# Patient Record
Sex: Female | Born: 1960 | Race: White | Hispanic: No | Marital: Single | Smoking: Current every day smoker
Health system: Southern US, Community
[De-identification: ages and names within clinical notes are randomized; demographics above are authoritative.]

## PROBLEM LIST (undated history)

## (undated) DIAGNOSIS — F29 Unspecified psychosis not due to a substance or known physiological condition: Secondary | ICD-10-CM

## (undated) DIAGNOSIS — E079 Disorder of thyroid, unspecified: Secondary | ICD-10-CM

---

## 2011-03-17 DIAGNOSIS — F29 Unspecified psychosis not due to a substance or known physiological condition: Secondary | ICD-10-CM | POA: Insufficient documentation

## 2014-04-11 DIAGNOSIS — Z8669 Personal history of other diseases of the nervous system and sense organs: Secondary | ICD-10-CM | POA: Insufficient documentation

## 2015-10-30 ENCOUNTER — Telehealth: Payer: Self-pay | Admitting: General Practice

## 2015-10-30 NOTE — Telephone Encounter (Signed)
No- I can't take her on at this time

## 2015-10-30 NOTE — Telephone Encounter (Signed)
Pt's mother Corinna Capra is a patient of yours and she says she's met you before and she would like to establish care with you. Please advise

## 2015-11-02 NOTE — Telephone Encounter (Signed)
appt made

## 2015-11-02 NOTE — Telephone Encounter (Signed)
With Tammy SoursGreg

## 2015-11-10 DIAGNOSIS — F1020 Alcohol dependence, uncomplicated: Secondary | ICD-10-CM | POA: Insufficient documentation

## 2015-11-20 ENCOUNTER — Ambulatory Visit: Payer: Self-pay | Admitting: Family

## 2015-12-15 ENCOUNTER — Ambulatory Visit: Payer: Self-pay | Admitting: Internal Medicine

## 2015-12-21 ENCOUNTER — Ambulatory Visit: Payer: Self-pay | Admitting: Family

## 2017-02-06 DIAGNOSIS — F17200 Nicotine dependence, unspecified, uncomplicated: Secondary | ICD-10-CM | POA: Insufficient documentation

## 2019-02-09 ENCOUNTER — Emergency Department
Admission: EM | Admit: 2019-02-09 | Discharge: 2019-02-09 | Disposition: A | Discharge status: Home / Self Care | Payer: Medicare HMO | Source: Home / Self Care | Attending: Family Medicine | Admitting: Family Medicine

## 2019-02-09 ENCOUNTER — Other Ambulatory Visit: Payer: Self-pay

## 2019-02-09 ENCOUNTER — Encounter: Payer: Self-pay | Admitting: Emergency Medicine

## 2019-02-09 DIAGNOSIS — K0889 Other specified disorders of teeth and supporting structures: Secondary | ICD-10-CM | POA: Diagnosis not present

## 2019-02-09 HISTORY — DX: Disorder of thyroid, unspecified: E07.9

## 2019-02-09 MED ORDER — AMOXICILLIN 875 MG PO TABS: 875.0000 mg | ORAL_TABLET | Freq: Two times a day (BID) | ORAL | 0 refills | Status: DC

## 2019-02-09 MED ORDER — HYDROCODONE-ACETAMINOPHEN 5-325 MG PO TABS: ORAL_TABLET | ORAL | 0 refills | Status: DC

## 2019-02-09 NOTE — ED Provider Notes (Signed)
[name]    CSN: [id number] Arrival date & time: 02/09/19  1045     History   Chief Complaint Chief Complaint  Patient presents with  . Dental Pain    HPI Chelsea Grant is a 58 y.o. female.   Patient reports that a tooth in her right lower jaw chipped yesterday.  Today she has developed pain and mild swelling in her right jaw.  She denies fevers, chills, and sweats.  The history is provided by the patient.  Dental Pain  Location:  Lower Lower teeth location:  27/RL cuspid and 28/RL 1st bicuspid Quality:  Aching Severity:  Moderate Onset quality:  Sudden Duration:  1 day Timing:  Constant Progression:  Worsening Chronicity:  New Context: dental fracture   Relieved by:  Nothing Ineffective treatments:  NSAIDs Associated symptoms: facial swelling and gum swelling   Associated symptoms: no difficulty swallowing, no drooling, no facial pain, no fever, no headaches, no neck pain, no neck swelling, no oral bleeding, no oral lesions and no trismus   Risk factors: lack of dental care     Past Medical History:  Diagnosis Date  . Thyroid disease     There are no active problems to display for this patient.   History reviewed. No pertinent surgical history.  OB History   No obstetric history on file.      Home Medications    Prior to Admission medications   Medication Sig Start Date End Date Taking? Authorizing Provider  levothyroxine (SYNTHROID, LEVOTHROID) 75 MCG tablet Take 75 mcg by mouth daily before breakfast.   Yes [provider]  amoxicillin (AMOXIL) 875 MG tablet Take 1 tablet (875 mg total) by mouth 2 (two) times daily. 02/09/19   Lattie Haw, MD  HYDROcodone-acetaminophen (NORCO/VICODIN) 5-325 MG tablet Take one by mouth at bedtime as needed for pain.  May repeat in 4 to 6 hr prn 02/09/19   Lattie Haw, MD    Family History No family history on file.  Social History Social History   Tobacco Use  . Smoking  status: Current Every Day Smoker  Substance Use Topics  . Alcohol use: Yes  . Drug use: Not on file     Allergies   Patient has no known allergies.   Review of Systems Review of Systems  Constitutional: Negative for fever.  HENT: Positive for facial swelling. Negative for drooling and mouth sores.   Musculoskeletal: Negative for neck pain.  Neurological: Negative for headaches.  All other systems reviewed and are negative.    Physical Exam Triage Vital Signs ED Triage Vitals  Enc Vitals Group     BP 02/09/19 1136 97/63     Pulse Rate 02/09/19 1136 67     Resp 02/09/19 1136 18     Temp 02/09/19 1136 98.1 F (36.7 C)     Temp Source 02/09/19 1136 Oral     SpO2 02/09/19 1136 100 %     Weight 02/09/19 1138 135 lb (61.2 kg)     Height 02/09/19 1138 5' 5.5" (1.664 m)     Head Circumference --      Peak Flow --      Pain Score 02/09/19 1138 7     Pain Loc --      Pain Edu? --      Excl. in [place]? --    No data found.  Updated Vital Signs BP 97/63 (BP Location: Right Arm)   Pulse 67   Temp 98.1  F (36.7 C) (Oral)   Resp 18   Ht 5' 5.5" (1.664 m)   Wt 61.2 kg   SpO2 100%   BMI 22.12 kg/m   Visual Acuity Right Eye Distance:   Left Eye Distance:   Bilateral Distance:    Right Eye Near:   Left Eye Near:    Bilateral Near:     Physical Exam Vitals signs and nursing note reviewed.  Constitutional:      General: She is not in acute distress. HENT:     Head: Normocephalic.     Right Ear: Tympanic membrane and external ear normal.     Left Ear: Tympanic membrane and external ear normal.     Nose: Nose normal.     Mouth/Throat:     Lips: Pink.     Mouth: Mucous membranes are moist.     Dentition: Dental tenderness and dental caries present. No gingival swelling, dental abscesses or gum lesions.     Tongue: No lesions.     Pharynx: Oropharynx is clear.      Comments: Teeth and gingiva tender to palpation as noted on diagram.  Eyes:     Pupils: Pupils are  equal, round, and reactive to light.  Cardiovascular:     Rate and Rhythm: Normal rate.  Pulmonary:     Effort: Pulmonary effort is normal.  Lymphadenopathy:     Cervical: No cervical adenopathy.  Skin:    General: Skin is warm and dry.  Neurological:     Mental Status: She is alert.      UC Treatments / Results  Labs (all labs ordered are listed, but only abnormal results are displayed) Labs Reviewed - No data to display  EKG None  Radiology No results found.  Procedures Procedures (including critical care time)  Medications Ordered in UC Medications - No data to display  Initial Impression / Assessment and Plan / UC Course  I have reviewed the triage vital signs and the nursing notes.  Pertinent labs & imaging results that were available during my care of the patient were reviewed by me and considered in my medical decision making (see chart for details).    Begin amoxicillin.  Rx for Lortab for pain at night (#10, no refill).  Controlled Substance Prescriptions I have consulted the [place] Controlled Substances Registry for this patient, and feel the risk/benefit ratio today is favorable for proceeding with this prescription for a controlled substance.   Followup with dentist in about 10 days.   Final Clinical Impressions(s) / UC Diagnoses   Final diagnoses:  Pain, dental     Discharge Instructions     May continue Ibuprofen 200mg , 4 tabs every 8 hours with food.  If symptoms become significantly worse during the night or over the weekend, proceed to the local emergency room.     ED Prescriptions    Medication Sig Dispense Auth. Provider   amoxicillin (AMOXIL) 875 MG tablet Take 1 tablet (875 mg total) by mouth 2 (two) times daily. 20 tablet Lattie Haw, MD   HYDROcodone-acetaminophen (NORCO/VICODIN) 5-325 MG tablet Take one by mouth at bedtime as needed for pain.  May repeat in 4 to 6 hr prn 10 tablet Cathren Harsh Tera Mater, MD        Lattie Haw,  MD 02/21/19 [phone]

## 2019-02-09 NOTE — ED Triage Notes (Signed)
Patient broke tooth off in lower right jaw yesterday; now has pain and edema along jaw. Took ibuprofen 400mg  at around 0900. Does have DDS to follow with.

## 2019-02-09 NOTE — Discharge Instructions (Addendum)
May continue Ibuprofen 200mg, 4 tabs every 8 hours with food.  °If symptoms become significantly worse during the night or over the weekend, proceed to the local emergency room.  °

## 2019-04-20 DIAGNOSIS — F319 Bipolar disorder, unspecified: Secondary | ICD-10-CM | POA: Insufficient documentation

## 2020-03-18 ENCOUNTER — Emergency Department: Payer: Medicare HMO

## 2020-03-18 ENCOUNTER — Emergency Department
Admission: EM | Admit: 2020-03-18 | Discharge: 2020-03-18 | Disposition: A | Discharge status: Home / Self Care | Payer: Medicare HMO | Attending: Student | Admitting: Student

## 2020-03-18 ENCOUNTER — Other Ambulatory Visit: Payer: Self-pay

## 2020-03-18 DIAGNOSIS — M79605 Pain in left leg: Secondary | ICD-10-CM | POA: Diagnosis not present

## 2020-03-18 DIAGNOSIS — R413 Other amnesia: Secondary | ICD-10-CM | POA: Insufficient documentation

## 2020-03-18 DIAGNOSIS — M542 Cervicalgia: Secondary | ICD-10-CM | POA: Diagnosis not present

## 2020-03-18 DIAGNOSIS — K5900 Constipation, unspecified: Secondary | ICD-10-CM | POA: Diagnosis not present

## 2020-03-18 DIAGNOSIS — R140 Abdominal distension (gaseous): Secondary | ICD-10-CM | POA: Insufficient documentation

## 2020-03-18 DIAGNOSIS — M25532 Pain in left wrist: Secondary | ICD-10-CM | POA: Insufficient documentation

## 2020-03-18 DIAGNOSIS — F17200 Nicotine dependence, unspecified, uncomplicated: Secondary | ICD-10-CM | POA: Diagnosis not present

## 2020-03-18 DIAGNOSIS — Y92002 Bathroom of unspecified non-institutional (private) residence single-family (private) house as the place of occurrence of the external cause: Secondary | ICD-10-CM | POA: Insufficient documentation

## 2020-03-18 DIAGNOSIS — W19XXXA Unspecified fall, initial encounter: Secondary | ICD-10-CM | POA: Insufficient documentation

## 2020-03-18 DIAGNOSIS — Y92009 Unspecified place in unspecified non-institutional (private) residence as the place of occurrence of the external cause: Secondary | ICD-10-CM

## 2020-03-18 DIAGNOSIS — Y939 Activity, unspecified: Secondary | ICD-10-CM | POA: Insufficient documentation

## 2020-03-18 DIAGNOSIS — Y999 Unspecified external cause status: Secondary | ICD-10-CM | POA: Diagnosis not present

## 2020-03-18 DIAGNOSIS — R5383 Other fatigue: Secondary | ICD-10-CM | POA: Insufficient documentation

## 2020-03-18 DIAGNOSIS — E039 Hypothyroidism, unspecified: Secondary | ICD-10-CM | POA: Diagnosis not present

## 2020-03-18 DIAGNOSIS — R519 Headache, unspecified: Secondary | ICD-10-CM | POA: Insufficient documentation

## 2020-03-18 DIAGNOSIS — M79604 Pain in right leg: Secondary | ICD-10-CM | POA: Diagnosis not present

## 2020-03-18 DIAGNOSIS — M549 Dorsalgia, unspecified: Secondary | ICD-10-CM | POA: Diagnosis not present

## 2020-03-18 DIAGNOSIS — R5381 Other malaise: Secondary | ICD-10-CM | POA: Insufficient documentation

## 2020-03-18 LAB — CBC WITH DIFFERENTIAL/PLATELET
Abs Immature Granulocytes: 0.02 10*3/uL (ref 0.00–0.07)
Basophils Absolute: 0.1 10*3/uL (ref 0.0–0.1)
Basophils Relative: 1 %
Eosinophils Absolute: 0.1 10*3/uL (ref 0.0–0.5)
Eosinophils Relative: 1 %
HCT: 37.2 % (ref 36.0–46.0)
Hemoglobin: 12.7 g/dL (ref 12.0–15.0)
Immature Granulocytes: 0 %
Lymphocytes Relative: 35 %
Lymphs Abs: 2.8 10*3/uL (ref 0.7–4.0)
MCH: 33.5 pg (ref 26.0–34.0)
MCHC: 34.1 g/dL (ref 30.0–36.0)
MCV: 98.2 fL (ref 80.0–100.0)
Monocytes Absolute: 0.6 10*3/uL (ref 0.1–1.0)
Monocytes Relative: 7 %
Neutro Abs: 4.6 10*3/uL (ref 1.7–7.7)
Neutrophils Relative %: 56 %
Platelets: 376 10*3/uL (ref 150–400)
RBC: 3.79 MIL/uL — ABNORMAL LOW (ref 3.87–5.11)
RDW: 13.4 % (ref 11.5–15.5)
WBC: 8.2 10*3/uL (ref 4.0–10.5)
nRBC: 0 % (ref 0.0–0.2)

## 2020-03-18 LAB — URINE DRUG SCREEN, QUALITATIVE (ARMC ONLY)
Amphetamines, Ur Screen: NOT DETECTED
Barbiturates, Ur Screen: NOT DETECTED
Benzodiazepine, Ur Scrn: NOT DETECTED
Cannabinoid 50 Ng, Ur ~~LOC~~: NOT DETECTED
Cocaine Metabolite,Ur ~~LOC~~: NOT DETECTED
MDMA (Ecstasy)Ur Screen: NOT DETECTED
Methadone Scn, Ur: NOT DETECTED
Opiate, Ur Screen: NOT DETECTED
Phencyclidine (PCP) Ur S: NOT DETECTED
Tricyclic, Ur Screen: NOT DETECTED

## 2020-03-18 LAB — URINALYSIS, COMPLETE (UACMP) WITH MICROSCOPIC
Bacteria, UA: NONE SEEN
Bilirubin Urine: NEGATIVE
Glucose, UA: NEGATIVE mg/dL
Hgb urine dipstick: NEGATIVE
Ketones, ur: NEGATIVE mg/dL
Leukocytes,Ua: NEGATIVE
Nitrite: NEGATIVE
Protein, ur: NEGATIVE mg/dL
Specific Gravity, Urine: 1.028 (ref 1.005–1.030)
pH: 7 (ref 5.0–8.0)

## 2020-03-18 LAB — BASIC METABOLIC PANEL
Anion gap: 10 (ref 5–15)
BUN: 14 mg/dL (ref 6–20)
CO2: 23 mmol/L (ref 22–32)
Calcium: 8.1 mg/dL — ABNORMAL LOW (ref 8.9–10.3)
Chloride: 102 mmol/L (ref 98–111)
Creatinine, Ser: 0.9 mg/dL (ref 0.44–1.00)
GFR calc Af Amer: >60 mL/min (ref >60)
GFR calc non Af Amer: >60 mL/min (ref >60)
Glucose, Bld: 115 mg/dL — ABNORMAL HIGH (ref 70–99)
Potassium: 3.7 mmol/L (ref 3.5–5.1)
Sodium: 135 mmol/L (ref 135–145)

## 2020-03-18 LAB — TSH: TSH: 56 u[IU]/mL — ABNORMAL HIGH (ref 0.350–4.500)

## 2020-03-18 MED ORDER — POLYETHYLENE GLYCOL 3350 17 G PO PACK
17.0000 g | PACK | Freq: Every day | ORAL | Status: DC
  Administered 2020-03-18: 17 g via ORAL
  Filled 2020-03-18: qty 1

## 2020-03-18 MED ORDER — DICYCLOMINE HCL 10 MG PO CAPS
20.0000 mg | ORAL_CAPSULE | Freq: Once | ORAL | Status: AC
  Administered 2020-03-18: 20 mg via ORAL
  Filled 2020-03-18: qty 2

## 2020-03-18 MED ORDER — MAGNESIUM CITRATE PO SOLN
1.0000 | Freq: Once | ORAL | Status: AC
  Administered 2020-03-18: 1 via ORAL
  Filled 2020-03-18: qty 296

## 2020-03-18 MED ORDER — TRAMADOL HCL 50 MG PO TABS
50.0000 mg | ORAL_TABLET | Freq: Once | ORAL | Status: AC
  Administered 2020-03-18: 50 mg via ORAL
  Filled 2020-03-18: qty 1

## 2020-03-18 MED ORDER — LEVOTHYROXINE SODIUM 125 MCG PO TABS: 125.0000 ug | ORAL_TABLET | Freq: Every day | ORAL | 2 refills | Status: DC

## 2020-03-18 MED ORDER — DEXAMETHASONE SODIUM PHOSPHATE 10 MG/ML IJ SOLN
10.0000 mg | Freq: Once | INTRAMUSCULAR | Status: AC
  Administered 2020-03-18: 10 mg via INTRAMUSCULAR
  Filled 2020-03-18: qty 1

## 2020-03-18 MED ORDER — LEVOTHYROXINE SODIUM 50 MCG PO TABS
150.0000 ug | ORAL_TABLET | Freq: Once | ORAL | Status: AC
  Administered 2020-03-18: 150 ug via ORAL
  Filled 2020-03-18: qty 3

## 2020-03-18 NOTE — ED Provider Notes (Signed)
[facility] Emergency Department Provider Note ____________________________________________  Time seen: 1418  I have reviewed the triage vital signs and the nursing notes.  HISTORY  Chief Complaint  Fall  HPI Chelsea Grant is a 59 y.o. female presents herself to the ED to EMS from home, with a complaint of 4 to 5 months worth of fatigue, malaise, and poor energy.  She also reports forgetfulness, bloating, and constipation.  Patient denies any outright fevers, chills, sweats patient also denies any chest pain, shortness of breath, or syncope.  She does admit to a mechanical fall last night resulting in her hitting her head while in the bathroom.  She describes pain to the left side of the forehead as well as some left wrist pain.   Patient also reports generalized pain to the neck, back, and legs.  She describes "pain all over.".  Patient has been evaluated on previous occasions at [facility] emergency department with similar complaints.  She has failed to follow with primary provider in the interim.  Patient with a history of hypothyroidism also admits to not having her thyroid medicine in over 3 months.  She describes making contact with her provider prior to that requested an increase in her medication dose, but has been unsuccessful.  She presents now for evaluation of her multiple complaints.  Past Medical History:  Diagnosis Date  . Thyroid disease     There are no problems to display for this patient.   History reviewed. No pertinent surgical history.  Prior to Admission medications   Medication Sig Start Date End Date Taking? Authorizing Provider  levothyroxine (SYNTHROID) 125 MCG tablet Take 1 tablet (125 mcg total) by mouth daily. 03/18/20 06/16/20  Kelleigh Skerritt, Dannielle Karvonen, PA-C    Allergies Patient has no known allergies.  No family history on file.  Social History Social History   Tobacco Use  . Smoking status: Current Every Day Smoker   Substance Use Topics  . Alcohol use: Yes  . Drug use: Not on file    Review of Systems  Constitutional: Negative for fever.  Reports generalized fatigue. Eyes: Negative for visual changes. ENT: Negative for sore throat. Cardiovascular: Negative for chest pain. Respiratory: Negative for shortness of breath. Gastrointestinal: Negative for abdominal pain, vomiting and diarrhea. Genitourinary: Negative for dysuria. Musculoskeletal: Negative for back pain.  Reports generalized myalgias.  Reports left wrist pain. Skin: Negative for rash. Neurological: Negative for headaches, focal weakness or numbness. ____________________________________________  PHYSICAL EXAM:  VITAL SIGNS: ED Triage Vitals  Enc Vitals Group     BP 03/18/20 1333 101/66     Pulse Rate 03/18/20 1333 78     Resp 03/18/20 1333 18     Temp 03/18/20 1333 99.2 F (37.3 C)     Temp src --      SpO2 03/18/20 1333 95 %     Weight 03/18/20 1331 134 lb 14.7 oz (61.2 kg)     Height 03/18/20 1331 5\' 4"  (1.626 m)     Head Circumference --      Peak Flow --      Pain Score 03/18/20 1331 10     Pain Loc --      Pain Edu? --      Excl. in [place]? --     Constitutional: Alert and oriented. Well appearing and in no distress. Head: Normocephalic and atraumatic.  No battle sign, raccoon eyes, soft tissue swelling, abrasion, lacerations. Eyes: Conjunctivae are normal. PERRL. Normal extraocular movements and  fundi bilaterally.  No exophthalmos noted. Ears: Canals clear. TMs intact bilaterally. Nose: No congestion/rhinorrhea/epistaxis. Mouth/Throat: Mucous membranes are moist. Neck: Supple. No thyromegaly. Cardiovascular: Normal rate, regular rhythm. Normal distal pulses. Respiratory: Normal respiratory effort. No wheezes/rales/rhonchi. Gastrointestinal: Soft and nontender. No distention. Musculoskeletal: Nontender with normal range of motion in all extremities.  Neurologic: Cranial nerves II through XII grossly intact.  Normal  UE DTRs bilaterally.  Normal gait without ataxia. Normal speech and language. No gross focal neurologic deficits are appreciated. Skin:  Skin is warm, dry and intact. No rash noted. Psychiatric: Mood and affect are normal. Patient exhibits appropriate insight and judgment. ____________________________________________   LABS (pertinent positives/negatives) Labs Reviewed  URINALYSIS, COMPLETE (UACMP) WITH MICROSCOPIC - Abnormal; Notable for the following components:      Result Value   Color, Urine YELLOW (*)    APPearance HAZY (*)    All other components within normal limits  BASIC METABOLIC PANEL - Abnormal; Notable for the following components:   Glucose, Bld 115 (*)    Calcium 8.1 (*)    All other components within normal limits  CBC WITH DIFFERENTIAL/PLATELET - Abnormal; Notable for the following components:   RBC 3.79 (*)    All other components within normal limits  TSH - Abnormal; Notable for the following components:   TSH 56.000 (*)    All other components within normal limits  URINE DRUG SCREEN, QUALITATIVE ([facility] ONLY)  T3  T4, FREE  ____________________________________________   RADIOLOGY  CT Head w/o CM IMPRESSION: Negative head CT.  ABD 1 View IMPRESSION: Moderate colonic stool burden. No evidence of bowel obstruction or free air.  DG Left Wrist IMPRESSION: No fracture or dislocation of the left wrist. ____________________________________________  PROCEDURES  Miralax 17 g PO Mag Citrate 1 bottle Bentyl 20 mg PO Ultram 50 mg PO Levothyroxine 150 mcg PO  Procedures ____________________________________________  INITIAL IMPRESSION / ASSESSMENT AND PLAN / ED COURSE  Patient with ED evaluation of multiple complaints including generalized fatigue, malaise, mental fogginess, and constipation.  Patient's exam is overall benign reassuring that any acute neuromuscular deficit.  CT of the head does not reveal any acute intracranial process.  Abdomen XR does  reveal moderate stool burden as well as some colonic gas.  No fracture of the left wrist is appreciated.  Patient's clinical picture is consistent with an acute hypothyroidism that is currently uncontrolled.  Patient general symptoms are also consistent with her current uncontrolled hypothyroidism.  Patient will be given a dose of levothyroxine at 150 here in the ED.  Prescription for levothyroxine at 125 mcg is provided.  She is encouraged to follow-up with primary provider for ongoing evaluation and maintenance.  Return precautions have been reviewed, patient verbalized understanding of her need to follow-up and be managed closely for her thyroid disorder.  Free T3/T4 are pending at the time of this disposition.  Kayah Hecker was evaluated in Emergency Department on 03/18/2020 for the symptoms described in the history of present illness. She was evaluated in the context of the global COVID-19 pandemic, which necessitated consideration that the patient might be at risk for infection with the SARS-CoV-2 virus that causes COVID-19. Institutional protocols and algorithms that pertain to the evaluation of patients at risk for COVID-19 are in a state of rapid change based on information released by regulatory bodies including the CDC and federal and state organizations. These policies and algorithms were followed during the patient's care in the ED. ____________________________________________  FINAL CLINICAL IMPRESSION(S) / ED DIAGNOSES  Final diagnoses:  Abdominal bloating  Fall at home, initial encounter  Hypothyroidism, unspecified type      Lissa Hoard, PA-C 03/18/20 1855    Miguel Aschoff., MD 03/18/20 2131

## 2020-03-18 NOTE — ED Triage Notes (Addendum)
Pt comes via EMS with c/o fall. Pt fell in bathroom night. Pt c/o headache and left wrist pain.  Pt states pain in neck and back and legs. Pt states pain all over.  Pt states it hurts to stand up.

## 2020-03-18 NOTE — Discharge Instructions (Addendum)
You should follow up with your primary provider for ongoing management of your hypothyroidism.  A prescription has been provided for you benefit.  Take as directed or return to the ED for acutely worsening symptoms.

## 2020-05-20 ENCOUNTER — Emergency Department (HOSPITAL_COMMUNITY)
Admission: EM | Admit: 2020-05-20 | Discharge: 2020-05-21 | Disposition: A | Discharge status: Home / Self Care | Payer: Medicare HMO | Attending: Emergency Medicine | Admitting: Emergency Medicine

## 2020-05-20 ENCOUNTER — Encounter (HOSPITAL_COMMUNITY): Payer: Self-pay

## 2020-05-20 DIAGNOSIS — M542 Cervicalgia: Secondary | ICD-10-CM | POA: Diagnosis not present

## 2020-05-20 DIAGNOSIS — R1084 Generalized abdominal pain: Secondary | ICD-10-CM | POA: Insufficient documentation

## 2020-05-20 DIAGNOSIS — F320 Major depressive disorder, single episode, mild: Secondary | ICD-10-CM | POA: Diagnosis not present

## 2020-05-20 DIAGNOSIS — Z20822 Contact with and (suspected) exposure to covid-19: Secondary | ICD-10-CM | POA: Insufficient documentation

## 2020-05-20 DIAGNOSIS — F17210 Nicotine dependence, cigarettes, uncomplicated: Secondary | ICD-10-CM | POA: Diagnosis not present

## 2020-05-20 DIAGNOSIS — E039 Hypothyroidism, unspecified: Secondary | ICD-10-CM | POA: Insufficient documentation

## 2020-05-20 DIAGNOSIS — M549 Dorsalgia, unspecified: Secondary | ICD-10-CM | POA: Diagnosis not present

## 2020-05-20 DIAGNOSIS — R519 Headache, unspecified: Secondary | ICD-10-CM | POA: Insufficient documentation

## 2020-05-20 DIAGNOSIS — R413 Other amnesia: Secondary | ICD-10-CM | POA: Diagnosis present

## 2020-05-20 DIAGNOSIS — F4323 Adjustment disorder with mixed anxiety and depressed mood: Secondary | ICD-10-CM | POA: Insufficient documentation

## 2020-05-20 DIAGNOSIS — Z79899 Other long term (current) drug therapy: Secondary | ICD-10-CM | POA: Diagnosis not present

## 2020-05-20 DIAGNOSIS — F319 Bipolar disorder, unspecified: Secondary | ICD-10-CM

## 2020-05-20 LAB — URINALYSIS, ROUTINE W REFLEX MICROSCOPIC
Glucose, UA: NEGATIVE mg/dL
Hgb urine dipstick: NEGATIVE
Ketones, ur: 5 mg/dL — AB
Nitrite: NEGATIVE
Protein, ur: 30 mg/dL — AB
Specific Gravity, Urine: 1.038 — ABNORMAL HIGH (ref 1.005–1.030)
pH: 5 (ref 5.0–8.0)

## 2020-05-20 LAB — CBC WITH DIFFERENTIAL/PLATELET
Abs Immature Granulocytes: 0.04 10*3/uL (ref 0.00–0.07)
Basophils Absolute: 0.1 10*3/uL (ref 0.0–0.1)
Basophils Relative: 1 %
Eosinophils Absolute: 0.2 10*3/uL (ref 0.0–0.5)
Eosinophils Relative: 2 %
HCT: 42.7 % (ref 36.0–46.0)
Hemoglobin: 14.4 g/dL (ref 12.0–15.0)
Immature Granulocytes: 0 %
Lymphocytes Relative: 25 %
Lymphs Abs: 3 10*3/uL (ref 0.7–4.0)
MCH: 33.5 pg (ref 26.0–34.0)
MCHC: 33.7 g/dL (ref 30.0–36.0)
MCV: 99.3 fL (ref 80.0–100.0)
Monocytes Absolute: 0.8 10*3/uL (ref 0.1–1.0)
Monocytes Relative: 6 %
Neutro Abs: 7.8 10*3/uL — ABNORMAL HIGH (ref 1.7–7.7)
Neutrophils Relative %: 66 %
Platelets: 552 10*3/uL — ABNORMAL HIGH (ref 150–400)
RBC: 4.3 MIL/uL (ref 3.87–5.11)
RDW: 12 % (ref 11.5–15.5)
WBC: 11.8 10*3/uL — ABNORMAL HIGH (ref 4.0–10.5)
nRBC: 0 % (ref 0.0–0.2)

## 2020-05-20 LAB — COMPREHENSIVE METABOLIC PANEL
ALT: 33 U/L (ref 0–44)
AST: 30 U/L (ref 15–41)
Albumin: 4.8 g/dL (ref 3.5–5.0)
Alkaline Phosphatase: 87 U/L (ref 38–126)
Anion gap: 12 (ref 5–15)
BUN: 19 mg/dL (ref 6–20)
CO2: 25 mmol/L (ref 22–32)
Calcium: 9.4 mg/dL (ref 8.9–10.3)
Chloride: 103 mmol/L (ref 98–111)
Creatinine, Ser: 1.07 mg/dL — ABNORMAL HIGH (ref 0.44–1.00)
GFR calc Af Amer: >60 mL/min (ref >60)
GFR calc non Af Amer: 57 mL/min — ABNORMAL LOW (ref >60)
Glucose, Bld: 114 mg/dL — ABNORMAL HIGH (ref 70–99)
Potassium: 4.8 mmol/L (ref 3.5–5.1)
Sodium: 140 mmol/L (ref 135–145)
Total Bilirubin: 1.5 mg/dL — ABNORMAL HIGH (ref 0.3–1.2)
Total Protein: 8.4 g/dL — ABNORMAL HIGH (ref 6.5–8.1)

## 2020-05-20 NOTE — ED Triage Notes (Signed)
Pt presents with c/o headache and confusion. Pt reports that she has been in the bed for 7 months and 3 months ago, she fell and has not "been right" since. Pt reports she was seen after the fall and had a CT but believes she needs an MRI. Pt is alert and oriented and able to answer all questions.

## 2020-05-20 NOTE — ED Provider Notes (Signed)
[facility]-EMERGENCY DEPT Provider Note   CSN: [id number] Arrival date & time: 05/20/20  1713     History Chief Complaint  Patient presents with   Headache    Chelsea Grant is a 59 y.o. female with a history of bipolar disorder, bipolar affective psychosis, hypothyroidism, anxiety who presents the emergency department with a chief complaint of "I'm having memory issues."   Patient states that she has been having memory issues for "a long time".  States that she can remember remote memories, but is having difficulty remembering more recent memories.  She reports that she is having multiple areas of pain, including a severe posterior headache, neck pain, back pain, abdominal pain, nausea, and pain in her legs.  She is unable to state how long the symptoms have been ongoing, but states that she spends all day in her room.  Also is concerned that she may have ruptured an eardrum as she has "drainage" from the left ear. She thinks that she has been taking her home levothyroxine, but is unsure if she has been taking other medications.  She is a current 1 pack/day smoker.  She reports that she used to use alcohol, but cannot remember if she has been drinking alcohol recently.  She does not believe that she has had any other illicit or recreational substance use.  Spoke with the patient's roommate, Tammy Sours.  Reports that she has been spending almost the entire day in her room since around the beginning of the year.  He reports they have lived together for several years that she had a similar issue last year where she began having erratic behavior and leaving puzzling notes and covering the TV.  He reports that she has been acting similarly, but behavior has not been quite as erratic as previous episodes.  He does note that she has been complaining of all over pain for months.  He does not know if she has been drinking alcohol, but has a low suspicion for illicit substance use.   He  states that last night she did state "that she would be better off dead."  He denies any HI, auditory, or visualizations.  She denies fever, chills, numbness, weakness, rash, neck stiffness, vomiting, diarrhea, dysuria, hematuria, vaginal pain, or bleeding.  She does have a history of hypothyroidism, but states that she cannot remember if she has been taking this medication.  Per chart review, she was seen by primary care yesterday and was noted to have presented with a wandering and bizarre history where she stated that she had been " a prisoner of my bed" for 7 months despite having multiple ER visits.  Per previous chart review on 6/1: "ED provider spoke with patients roommate, Tammy Sours, and reported the following, Roommate states that abnormal behavior has been going on for several months. States that patient was transferred to inpatient psychiatric rehab called [facility] but left a week ago and has been continuing to deteriorate. Roommate states that patient has self-isolated herself in the house and will continue to have conversations with unknown parties. Roommate states that patient has been experiencing delusions and odd behavior.  Level 5 caveat secondary to altered mental status.   The history is provided by the patient. No language interpreter was used.       Past Medical History:  Diagnosis Date   Thyroid disease     There are no problems to display for this patient.   History reviewed. No pertinent surgical history.   OB  History   No obstetric history on file.     History reviewed. No pertinent family history.  Social History   Tobacco Use   Smoking status: Current Every Day Smoker  Substance Use Topics   Alcohol use: Yes   Drug use: Not on file    Home Medications Prior to Admission medications   Medication Sig Start Date End Date Taking? Authorizing Provider  acetaminophen (TYLENOL) 500 MG tablet Take 500 mg by mouth every 6 (six) hours as needed for mild  pain.   Yes [provider]  levothyroxine (SYNTHROID) 125 MCG tablet Take 1 tablet (125 mcg total) by mouth daily. 03/18/20 06/16/20 Yes Menshew, Charlesetta Ivory, PA-C    Allergies    Patient has no known allergies.  Review of Systems   Review of Systems  Unable to perform ROS: Mental status change    Physical Exam Updated Vital Signs BP (!) 120/92 (BP Location: Left Arm)    Pulse 68    Temp 98.4 F (36.9 C) (Oral)    Resp 15    SpO2 98%   Physical Exam Vitals and nursing note reviewed.  Constitutional:      General: She is not in acute distress.    Appearance: She is not ill-appearing, toxic-appearing or diaphoretic.  HENT:     Head: Normocephalic.     Comments: Toilet paper is inserted into the left ear canal.  There appears to be brown-tannish discoloration on the toilet paper that appears very similar to the patient's current state of make-up.  Bilateral TMs are intact. Eyes:     Extraocular Movements: Extraocular movements intact.     Conjunctiva/sclera: Conjunctivae normal.     Pupils: Pupils are equal, round, and reactive to light.  Cardiovascular:     Rate and Rhythm: Normal rate and regular rhythm.     Pulses: Normal pulses.     Heart sounds: Normal heart sounds. No murmur heard.  No friction rub. No gallop.   Pulmonary:     Effort: Pulmonary effort is normal. No respiratory distress.     Breath sounds: No stridor. No wheezing, rhonchi or rales.  Chest:     Chest wall: No tenderness.  Abdominal:     General: There is distension.     Palpations: Abdomen is soft. There is no mass.     Tenderness: There is abdominal tenderness. There is no right CVA tenderness, left CVA tenderness, guarding or rebound.     Hernia: No hernia is present.     Comments: Abdomen is mildly distended, but soft.  Mild diffuse tenderness palpation throughout the abdomen.   Musculoskeletal:     Cervical back: Neck supple.  Skin:    General: Skin is warm.     Coloration: Skin is not  jaundiced.     Findings: No rash.  Neurological:     Mental Status: She is alert.     Comments: Cranial nerves II through XII are grossly intact.  GCS 15.  Ambulates independently with minimally antalgic gait.  No dysmetria with finger-to-nose bilaterally.  No pronator drift.  No ataxia.  5-5 strength against resistance of the bilateral upper and lower extremities.  Sensation is intact and equal throughout.  Patient is oriented to first and last name, year.  Other questions that she states "I don't know, I'm confused."  But through conversation is able to state that she lives in [place] and knows she is at the hospital because she saw the sign.  Psychiatric:  Attention and Perception: She does not perceive auditory or visual hallucinations.        Mood and Affect: Mood is anxious and elated.        Speech: Speech is rapid and pressured and tangential.        Behavior: Behavior is hyperactive.        Cognition and Memory: She exhibits impaired recent memory.        Judgment: Judgment is impulsive.     ED Results / Procedures / Treatments   Labs (all labs ordered are listed, but only abnormal results are displayed) Labs Reviewed  CBC WITH DIFFERENTIAL/PLATELET - Abnormal; Notable for the following components:      Result Value   WBC 11.8 (*)    Platelets 552 (*)    Neutro Abs 7.8 (*)    All other components within normal limits  COMPREHENSIVE METABOLIC PANEL - Abnormal; Notable for the following components:   Glucose, Bld 114 (*)    Creatinine, Ser 1.07 (*)    Total Protein 8.4 (*)    Total Bilirubin 1.5 (*)    GFR calc non Af Amer 57 (*)    All other components within normal limits  URINALYSIS, ROUTINE W REFLEX MICROSCOPIC - Abnormal; Notable for the following components:   Color, Urine AMBER (*)    APPearance HAZY (*)    Specific Gravity, Urine 1.038 (*)    Bilirubin Urine SMALL (*)    Ketones, ur 5 (*)    Protein, ur 30 (*)    Leukocytes,Ua SMALL (*)    Bacteria,  UA RARE (*)    All other components within normal limits  RAPID URINE DRUG SCREEN, HOSP PERFORMED - Abnormal; Notable for the following components:   Tetrahydrocannabinol RESULTS UNAVAILABLE DUE TO INTERFERING SUBSTANCE (*)    All other components within normal limits  TSH - Abnormal; Notable for the following components:   TSH 24.411 (*)    All other components within normal limits  SARS CORONAVIRUS 2 BY RT PCR (HOSPITAL ORDER, PERFORMED IN [facility])  URINE CULTURE  ETHANOL  ACETAMINOPHEN LEVEL  T4, FREE  AMMONIA  T3, FREE    EKG None  Radiology No results found.  Procedures Procedures (including critical care time)  Medications Ordered in ED Medications  ondansetron (ZOFRAN) tablet 4 mg (has no administration in time range)  zolpidem (AMBIEN) tablet 5 mg (5 mg Oral Given 05/21/20 9323)  ibuprofen (ADVIL) tablet 600 mg (600 mg Oral Given 05/21/20 5573)  nicotine (NICODERM CQ - dosed in mg/24 hours) patch 21 mg (21 mg Transdermal Patch Applied 05/21/20 0818)  alum & mag hydroxide-simeth (MAALOX/MYLANTA) 200-200-20 MG/5ML suspension 30 mL (30 mLs Oral Given 05/21/20 0608)  levothyroxine (SYNTHROID) tablet 125 mcg (has no administration in time range)    ED Course  I have reviewed the triage vital signs and the nursing notes.  Pertinent labs & imaging results that were available during my care of the patient were reviewed by me and considered in my medical decision making (see chart for details).    MDM Rules/Calculators/A&P                      59 year old female with a history of bipolar disorder, bipolar affective psychosis, hypothyroidism, anxiety presenting with complaints of memory loss for the last 7 months.  She also presents with a host of other somatic complaints including headache, neck pain, back pain, abdominal pain, nausea, and bilateral leg pain.  History  is initially limited based on patient's presenting complaints, but she appears to be alert  and oriented x4 on my evaluation.  I spoke with the patient's roommate, [name], who lived up with the patient for several years and reports that she has been acting more erratic over the last 7 months and reports that previously she had a similar episode a year ago with similar erratic behavior where she had a to be admitted to an inpatient psychiatric facility.  Patient's medical record has been reviewed extensively and she has been presenting to the ER multiple times with similar complaints for the last few months.   Vital signs are normal on my evaluation.  She has no neurologic deficits.  She has had multiple negative CT scans since her symptoms began.  Given the chronicity of her symptoms with a normal neurologic exam, MRI is not warranted at this time.  Regarding her somatic complaints, no focal findings.  Given concern for confusion, considered alcohol withdrawal or Wernicke's encephalopathy, this is less likely given that she has denied alcohol use during several recent ER visits.  Ammonia level is normal.  Ethanol level is not elevated.  UDS is negative.  She does have a history of hypothyroidism and there is questionable concern with compliance of her home levothyroxine.  TSH is elevated at 24.  Free T3 and free T4 pending.  We will restart the patient's home levothyroxine.  UA is not concerning for infection.  She has no electrolyte derangements.  No leukocytosis.  I have a low suspicion for infectious etiology.  However, given that roommate reports that she was endorsing SI last night, TTS has been consulted.  Psychiatry recommends inpatient admission.  On re-evaluation the patient's when discussing this recommendation, she states that she is here for her pain and that she does not want to be admitted.  Of note, on reevaluation, patient is now answering all questions appropriately.  She continues to be alert and oriented x4.  She no longer appears to have any confusion.  The patient was discussed  with Dr. Leonides Schanz, attending physician.  Given that patient is having erratic behavior with tangential speech and was endorsing SI to her roommate, will take out IVC paperwork on the patient.  IVC paperwork completed by Dr. Leonides Schanz.  Psych hold orders and home med orders placed. Please see psych team notes for further documentation of care/dispo. Pt stable at time of med clearance.     Final Clinical Impression(s) / ED Diagnoses Final diagnoses:  Bipolar 1 disorder [facility])    Rx / DC Orders ED Discharge Orders    None       Cass Edinger A, PA-C 05/21/20 0844    Ward, Delice Bison, DO 05/21/20 2307

## 2020-05-21 DIAGNOSIS — F4323 Adjustment disorder with mixed anxiety and depressed mood: Secondary | ICD-10-CM

## 2020-05-21 LAB — SARS CORONAVIRUS 2 BY RT PCR (HOSPITAL ORDER, PERFORMED IN ~~LOC~~ HOSPITAL LAB): SARS Coronavirus 2: NEGATIVE

## 2020-05-21 LAB — AMMONIA: Ammonia: 32 umol/L (ref 9–35)

## 2020-05-21 LAB — RAPID URINE DRUG SCREEN, HOSP PERFORMED
Amphetamines: NOT DETECTED
Barbiturates: NOT DETECTED
Benzodiazepines: NOT DETECTED
Cocaine: NOT DETECTED
Opiates: NOT DETECTED

## 2020-05-21 LAB — ETHANOL: Alcohol, Ethyl (B): <10 mg/dL (ref <10)

## 2020-05-21 LAB — TSH: TSH: 24.411 u[IU]/mL — ABNORMAL HIGH (ref 0.350–4.500)

## 2020-05-21 LAB — T4, FREE: Free T4: 1.05 ng/dL (ref 0.61–1.12)

## 2020-05-21 LAB — ACETAMINOPHEN LEVEL: Acetaminophen (Tylenol), Serum: 17 ug/mL (ref 10–30)

## 2020-05-21 MED ORDER — NICOTINE 21 MG/24HR TD PT24
21.0000 mg | MEDICATED_PATCH | Freq: Every day | TRANSDERMAL | Status: DC
  Administered 2020-05-21: 21 mg via TRANSDERMAL
  Filled 2020-05-21: qty 1

## 2020-05-21 MED ORDER — ZOLPIDEM TARTRATE 5 MG PO TABS
5.0000 mg | ORAL_TABLET | Freq: Every evening | ORAL | Status: DC | PRN
  Administered 2020-05-21: 5 mg via ORAL
  Filled 2020-05-21: qty 1

## 2020-05-21 MED ORDER — ONDANSETRON HCL 4 MG PO TABS: 4.0000 mg | ORAL_TABLET | Freq: Three times a day (TID) | ORAL | Status: DC | PRN

## 2020-05-21 MED ORDER — LEVOTHYROXINE SODIUM 25 MCG PO TABS
125.0000 ug | ORAL_TABLET | Freq: Every day | ORAL | Status: DC
  Administered 2020-05-21: 125 ug via ORAL
  Filled 2020-05-21: qty 1

## 2020-05-21 MED ORDER — ALUM & MAG HYDROXIDE-SIMETH 200-200-20 MG/5ML PO SUSP
30.0000 mL | Freq: Four times a day (QID) | ORAL | Status: DC | PRN
  Administered 2020-05-21: 30 mL via ORAL
  Filled 2020-05-21: qty 30

## 2020-05-21 MED ORDER — IBUPROFEN 200 MG PO TABS
600.0000 mg | ORAL_TABLET | Freq: Three times a day (TID) | ORAL | Status: DC | PRN
  Administered 2020-05-21: 600 mg via ORAL
  Filled 2020-05-21: qty 3

## 2020-05-21 NOTE — [facility] Suicide Risk Assessment (Cosign Needed)
Suicide Risk Assessment  Discharge Assessment   [facility] Discharge Suicide Risk Assessment   Principal Problem: Adjustment disorder with mixed anxiety and depressed mood Discharge Diagnoses: Principal Problem:   Adjustment disorder with mixed anxiety and depressed mood   Total Time spent with patient: 30 minutes  Musculoskeletal: Strength & Muscle Tone: within normal limits Gait & Station: normal Patient leans: N/A  Psychiatric Specialty Exam:   Blood pressure (!) 120/92, pulse 68, temperature 98.4 F (36.9 C), temperature source Oral, resp. rate 15, SpO2 98 %.There is no height or weight on file to calculate BMI.  General Appearance: Casual  Eye Contact::  Good  Speech:  Clear and Coherent and Normal Rate409  Volume:  Normal  Mood:  Anxious  Affect:  Appropriate and Congruent  Thought Process:  Coherent, Goal Directed and Descriptions of Associations: Intact  Orientation:  Full (Time, Place, and Person)  Thought Content:  WDL  Suicidal Thoughts:  No  Homicidal Thoughts:  No  Memory:  Immediate;   Good Recent;   Good  Judgement:  Intact  Insight:  Present  Psychomotor Activity:  Normal  Concentration:  Fair  Recall:  Good  Fund of Knowledge:Fair  Language: Good  Akathisia:  No  Handed:  Right  AIMS (if indicated):     Assets:  Communication Skills Desire for Improvement Housing Social Support  Sleep:     Cognition: WNL  ADL's:  Intact   Mental Status Per Nursing Assessment::   On Admission:    Chelsea Grant, 59 y.o., female patient seen via tele psych by this provider, Dr. Dwyane Dee; and chart reviewed on 05/21/20.  On evaluation Chelsea Grant reports she came to the hospital because she was having cervical pain and headaches and feeling like her eardrum is bursting.  Patient states "I am not crazy, and I'm not suicidal.  I just got out of a psychiatric hospital about a week ago."  Patient states that she started have cervical and back pain while helping her mother who  is wheel chair bound.  "After that I wasn't able to do much because of the pain.  I was bed ridden and hadn't been out in almost 7 months.  I just want to get my health back on track but nobody is listening to me.  I need to have a CT scan or MRI of my head."  Patient states that she has also went to her PCP office but her PCP wasn't in that day and the other doctor didn't order; so I could wait until Monday and see my doctor or go to the emergency room;  so decided to come to emergency room to see if I could get a CT scan or MRI of my head."  Patient states that she has been talking to herself; just making plans of things she needs to like "My first plan was to get up all of the clothing in my room and hang them up; I did that but couldn't get the clothing off of the floor because when I bend over the pain is worse."  Patient states that she lives with her room mate who is supportive.   During evaluation Chelsea Grant is alert/oriented x 4.  Patient was able to give the correct information on date of birth, age, current location, city, state, county, country, and name the last 3 presidents.  Patient is calm/cooperative through out assessment and her mood is congruent with affect.  She does not appear to be responding to internal/external stimuli  or delusional thoughts.  Patient denies suicidal/self-harm/homicidal ideation, psychosis, and paranoia.  Patient answered question appropriately.  Informed patient that she would need to follow up with her PCP and may need a referral to see a neurologist.  Ordered a social work consult to explain process to patient and also told patient that I would speak to the emergency room doctor about her complaints.     Spoke to Dr. Lockie Mola and informed of patient concerns related to cervical and head pain and wanting to get a CT scan or MRI; patient had been to PCP office but her PCP not in so came to the ED.  Patient expressing that she is having medical problems and not  psychiatric but no one is listening.  Dr. Lockie Mola informed that patient has previous head trauma and CT scan done at that time.  States that patient can follow     Demographic Factors:  Caucasian  Loss Factors: Decline in physical health  Historical Factors: Impulsivity  Risk Reduction Factors:   Religious beliefs about death, Living with another person, especially a relative and Positive social support  Continued Clinical Symptoms:  Previous Psychiatric Diagnoses and Treatments  Cognitive Features That Contribute To Risk:  None    Suicide Risk:  Minimal: No identifiable suicidal ideation.  Patients presenting with no risk factors but with morbid ruminations; may be classified as minimal risk based on the severity of the depressive symptoms  Plan Of Care/Follow-up recommendations:  Activity:  As tolerated Diet:  Heart healthy  Patient to follow up with PCP and social work ordered to give referrals to neurologist.  Keep scheduled appointment with psychiatric provider; but will give other resources.    Disposition:  Psychiatrically cleared No evidence of imminent risk to self or others at present.   Patient does not meet criteria for psychiatric inpatient admission. Supportive therapy provided about ongoing stressors. Discussed crisis plan, support from social network, calling 911, coming to the Emergency Department, and calling Suicide Hotline.  Chelsea Califano, NP 05/21/2020, 10:23 AM

## 2020-05-21 NOTE — [facility] Assessment (Signed)
[facility] Assessment Progress Note  Per Shuvon Rankin, FNP, this pt does not require psychiatric hospitalization at this time.  Pt presents under IVC initiated by EDP Kristen Ward, DO, which has been rescinded by Nelly Rout, MD.  Pt is to be discharged from [facility] with outpatient referrals.  These have been included in pt's discharge instructions.  Pt's nurse has been notified.  Doylene Canning, MA Triage Specialist [phone]

## 2020-05-21 NOTE — BH Assessment (Signed)
Tele Assessment Note   Patient Name: Chelsea Grant MRN: [id number] Referring Physician: Pryor Curia, DO Location of Patient: [facility] Location of Provider: [place] is an 59 y.o. female. Pt presents to [facility] voluntarily dropped off by her room mate Chelsea Grant for "memory issues" and pain. Pt states , " I cant remember anything, I haven't been able to tell the time or the day". Pt was asked what day and time it was and could not remember. Pt starts talking in tangents about going to school at [employer] and not remembering if she had a boyfriend or not. Pt denies current SI, HI, AVH and self harm. She reports past SI attempts but does not recall how or when. Pt denies any current or past drug use. Pt las currently pending. Per pt chart pt has hx of bipolar disorder and was last psychiatrically hospitalized  in May 2021 for similar presentation and has multiple in the past. Pt unsure of what doctor she is seeing but states she is given Risperdal shot every 2 weeks and thinks she is due for another one very soon.  Pt also expressed during assessment that she is in severe pain, states she has busted ear drum, head pain, migraines, decreased upper body strength, no energy, states she feels weak and stated she felt , "dead". Pt reports her sleep is not good and she could not recall her appetite. Pt denies symptoms of depression but presented tearful and states she isolated herself in her room at home for the last 7 months. Pt states she feels she has no quality of life. Pt reports she cant remember if she experienced abuse/trauma. Pt denies access to weapons/violence. Pt history limited throughout assessment due to memory issues. Pt states she is seeking help at this time but can contract for safety at this time.  Pt was a bit disoriented, tangential speech, in scrubs, mood depressed and sad affect congruent. Pt motor skills activity limited (pain), poor concentration, forgetfulness  with memory loss, recent impaired. Pt did not present to be responding to internal/external stimuli or delusional content.  Diagnosis:Bipolar I disorder, Current or most recent episode unspecified           Past Medical History:  Past Medical History:  Diagnosis Date  . Thyroid disease     History reviewed. No pertinent surgical history.  Family History: History reviewed. No pertinent family history.  Social History:  reports that she has been smoking. She does not have any smokeless tobacco history on file. She reports current alcohol use. No history on file for drug use.  Additional Social History:     CIWA: CIWA-Ar BP: 104/71 Pulse Rate: 72 COWS:    Allergies: No Known Allergies  Home Medications: (Not in a hospital admission)   OB/GYN Status:  No LMP recorded. Patient is postmenopausal.  General Assessment Data Location of Assessment: [facility] TTS Assessment: In system Is this a Tele or Face-to-Face Assessment?: Tele Assessment Is this an Initial Assessment or a Re-assessment for this encounter?: Initial Assessment Patient Accompanied by:: N/A Language Other than English: No Living Arrangements: Other (Comment) What gender do you identify as?: Female Date Telepsych consult ordered in [facility]: 05/20/20 Time Telepsych consult ordered in [facility]: 0011 Marital status: Single Pregnancy Status: No Living Arrangements: Non-relatives/Friends Can pt return to current living arrangement?: Yes Admission Status: Voluntary Is patient capable of signing voluntary admission?: Yes Referral Source: Self/Family/Friend Insurance type: [place] Living Arrangements:  Non-relatives/Friends Legal Guardian: Other: (self) Name of Psychiatrist: unknown Name of Therapist: unknown  Education Status Is patient currently in school?: No Is the patient employed, unemployed or receiving disability?: Receiving disability income  Risk to self with the past 6 months Suicidal  Ideation: No Has patient been a risk to self within the past 6 months prior to admission? : No Suicidal Intent: No Has patient had any suicidal intent within the past 6 months prior to admission? : No Is patient at risk for suicide?: No Suicidal Plan?: No Has patient had any suicidal plan within the past 6 months prior to admission? : No Access to Means: No What has been your use of drugs/alcohol within the last 12 months?: none Previous Attempts/Gestures: Yes How many times?:  (multiple) Other Self Harm Risks:  (unknown) Triggers for Past Attempts: Unknown Intentional Self Injurious Behavior: None Family Suicide History: Unable to assess Recent stressful life event(s): Other (Comment), Recent negative physical changes Persecutory voices/beliefs?: No Depression: Yes Depression Symptoms: Isolating, Tearfulness Substance abuse history and/or treatment for substance abuse?: No Suicide prevention information given to non-admitted patients: Not applicable  Risk to Others within the past 6 months Homicidal Ideation: No Does patient have any lifetime risk of violence toward others beyond the six months prior to admission? : No Thoughts of Harm to Others: No Current Homicidal Intent: No Current Homicidal Plan: No Access to Homicidal Means: No Identified Victim: none History of harm to others?: No Assessment of Violence: None Noted Violent Behavior Description: none Does patient have access to weapons?: No Criminal Charges Pending?: No Does patient have a court date: No Is patient on probation?: No  Psychosis Hallucinations: None noted Delusions: Unspecified  Mental Status Report Appearance/Hygiene: In hospital gown Eye Contact: Fair Motor Activity: Freedom of movement Speech: Logical/coherent Level of Consciousness: Quiet/awake Mood: Depressed, Sad Affect: Appropriate to circumstance, Sad Anxiety Level: None Thought Processes: Coherent, Relevant Judgement:  Impaired Orientation: Person, Time, Place, Situation Obsessive Compulsive Thoughts/Behaviors: None  Cognitive Functioning Concentration: Poor Memory: Recent Impaired, Remote Impaired Is patient IDD: No Insight: Poor Impulse Control: Poor Appetite:  (UTA) Have you had any weight changes? :  (UTA) Sleep: Unable to Assess Total Hours of Sleep:  (UTA) Vegetative Symptoms: Unable to Assess  ADLScreening [facility] Assessment Services) Patient's cognitive ability adequate to safely complete daily activities?: No Patient able to express need for assistance with ADLs?: Yes Independently performs ADLs?: Yes (appropriate for developmental age)  Prior Inpatient Therapy Prior Inpatient Therapy: Yes Prior Therapy Dates: 2021 Prior Therapy Facilty/Provider(s): [facility] Reason for Treatment: Bipolar disorder  Prior Outpatient Therapy Prior Outpatient Therapy:  (UTA)  ADL Screening (condition at time of admission) Patient's cognitive ability adequate to safely complete daily activities?: No Patient able to express need for assistance with ADLs?: Yes Independently performs ADLs?: Yes (appropriate for developmental age)             [employer]chant [employer] officer (For Healthcare) Does Patient Have a Medical Advance Directive?: No Would patient like information on creating a medical advance directive?: No - Patient declined          Disposition: Adaku, Anike, FNP recommends pt for inpatient treatment. TTS to seek placement, per [facility] no appropriate beds. TTS confirm with attending provider Disposition Initial Assessment Completed for this Encounter: Yes  This service was provided via telemedicine using a 2-way, interactive audio and video technology.  Names of all persons participating in this telemedicine service and their role in this encounter. Name: Chelsea Grant Role: Patient  Name:  Lacey Jensen Role: TTS  Name:  Role:   Name:  Role:     Natasha Mead 05/21/2020 3:29 AM

## 2020-05-21 NOTE — ED Notes (Signed)
Lab contacted to add on urine culture. 

## 2020-05-21 NOTE — ED Notes (Signed)
Pt calm and cooperative at this time, no needs expressed.  Pt ambulatory to bathroom, no assistance.

## 2020-05-21 NOTE — ED Notes (Signed)
Patient states she had a covid swab two weeks ago and is unsure if she wants another one. States to "give her a few minutes" for labs and covid swab because shes in pain.

## 2020-05-21 NOTE — Progress Notes (Signed)
TOC CM referral received for PCP assistance. Epic is showing pt has PCP. Attempting to verify with patient. Called pt via phone. Line busy. Isidoro Donning RN CCM, [facility] TOC CM [phone]

## 2020-05-21 NOTE — ED Notes (Signed)
Patient dressed in burgundy scrubs. Patient belongings labeled and placed in 19-22 cabinets. Patient has a purse and wallet. $40 in a ziplock bag and $18 inside the wallet. Patient states she wants all of her belongings together.

## 2020-05-21 NOTE — Discharge Instructions (Signed)
For you behavioral health needs, you are advised to follow up with an outpatient provider.  If you do not currently have a provider, contact one of the following at your earliest opportunity:       [facility] [address]      [place], [place] [address]      [phone]       [facility]      [address]      [place], [place] [address]      [phone]       [facility]      [address]       [place], [place] [address]      [phone]

## 2020-05-21 NOTE — ED Notes (Signed)
2 personal belongings bags returned to pt.

## 2020-05-21 NOTE — ED Notes (Signed)
An After Visit Summary was printed and given to the patient. Discharge instructions given and no further questions at this time. Pt educated on resources for obtaining a PCP.  Pt states a friend is coming to get her. Pt leaving with all her belongings.

## 2020-05-21 NOTE — ED Notes (Signed)
Patient given meal tray.

## 2020-05-22 ENCOUNTER — Telehealth: Payer: Self-pay | Admitting: *Deleted

## 2020-05-22 LAB — URINE CULTURE

## 2020-05-22 LAB — T3, FREE: T3, Free: 2.4 pg/mL (ref 2.0–4.4)

## 2020-05-22 NOTE — Telephone Encounter (Signed)
TOC CM attempted call to follow up on referral. Phone with no ring. And Mother's mailbox is full. Pt does have a PCP listed and insurance coverage.   Isidoro Donning RN CCM, [facility] TOC CM [phone]

## 2020-05-24 ENCOUNTER — Emergency Department
Admission: EM | Admit: 2020-05-24 | Discharge: 2020-05-24 | Disposition: A | Discharge status: Home / Self Care | Payer: Medicare HMO | Source: Home / Self Care

## 2020-05-24 ENCOUNTER — Other Ambulatory Visit: Payer: Self-pay

## 2020-05-24 ENCOUNTER — Encounter: Payer: Self-pay | Admitting: Emergency Medicine

## 2020-05-24 DIAGNOSIS — R42 Dizziness and giddiness: Secondary | ICD-10-CM

## 2020-05-24 DIAGNOSIS — R519 Headache, unspecified: Secondary | ICD-10-CM

## 2020-05-24 DIAGNOSIS — R112 Nausea with vomiting, unspecified: Secondary | ICD-10-CM

## 2020-05-24 MED ORDER — METOCLOPRAMIDE HCL 5 MG/ML IJ SOLN
5.0000 mg | Freq: Once | INTRAMUSCULAR | Status: AC
  Administered 2020-05-24: 5 mg via INTRAMUSCULAR

## 2020-05-24 MED ORDER — DEXAMETHASONE SODIUM PHOSPHATE 10 MG/ML IJ SOLN
10.0000 mg | Freq: Once | INTRAMUSCULAR | Status: AC
  Administered 2020-05-24: 10 mg via INTRAMUSCULAR

## 2020-05-24 MED ORDER — MECLIZINE HCL 25 MG PO TABS: 25.0000 mg | ORAL_TABLET | Freq: Two times a day (BID) | ORAL | 0 refills | Status: DC | PRN

## 2020-05-24 MED ORDER — KETOROLAC TROMETHAMINE 60 MG/2ML IM SOLN
60.0000 mg | Freq: Once | INTRAMUSCULAR | Status: AC
  Administered 2020-05-24: 60 mg via INTRAMUSCULAR

## 2020-05-24 NOTE — Discharge Instructions (Signed)
You may take 500mg  acetaminophen every 4-6 hours or in combination with ibuprofen 400-600mg  every 6-8 hours as needed for pain, inflammation, and fever.  Be sure to well hydrated with clear liquids and get at least 8 hours of sleep at night, preferably more while sick.   Please follow up with family medicine this week for recheck of symptoms and ongoing healthcare needs.  Emergency Department Resource Guide 1) Find a Doctor and Pay Out of Pocket Although you won't have to find out who is covered by your insurance plan, it is a good idea to ask around and get recommendations. You will then need to call the office and see if the doctor you have chosen will accept you as a new patient and what types of options they offer for patients who are self-pay. Some doctors offer discounts or will set up payment plans for their patients who do not have insurance, but you will need to ask so you aren't surprised when you get to your appointment.  2) Contact Your Local Health Department Not all health departments have doctors that can see patients for sick visits, but many do, so it is worth a call to see if yours does. If you don't know where your local health department is, you can check in your phone book. The CDC also has a tool to help you locate your state's health department, and many state websites also have listings of all of their local health departments.  3) Find a Walk-in Clinic If your illness is not likely to be very severe or complicated, you may want to try a walk in clinic. These are popping up all over the country in pharmacies, drugstores, and shopping centers. They're usually staffed by nurse practitioners or physician assistants that have been trained to treat common illnesses and complaints. They're usually fairly quick and inexpensive. However, if you have serious medical issues or chronic medical problems, these are probably not your best option.  No Primary Care Doctor: Call Health  Connect at  [phone] - they can help you locate a primary care doctor that  accepts your insurance, provides certain services, etc. Physician Referral Service- [phone]  Chronic Pain Problems: Organization         Address  Phone   Notes  [phone] Chronic Pain Clinic  [phone] Patients need to be referred by their primary care doctor.   Medication Assistance: Organization         Address  Phone   Notes  [facility] Medication [facility] [address] [place] [place], [place] [phone] --Must be a resident of [facility] -- Must have NO insurance coverage whatsoever (no Medicaid/ Medicare, etc.) -- The pt. MUST have a primary care doctor that directs their care regularly and follows them in the community   [facility]  [phone]   [phone]  [phone]    Agencies that provide inexpensive medical care: [phone]  Notes  [facility]  [phone]) [phone]   [facility]    [phone](336) [phone]   [facility] [address] [place], [place] [id number] [phone]) [phone]   [facility] [address], [place] [phone]) [phone]   Planned Parenthood    [phone]) [phone]   [facility]    [phone]) [phone]   [facility]  [address], [place] Phone:  [phone]) [phone], Fax:  [phone]) [phone] Hours of Operation:  9 am - 6 pm, M-F.  Also accepts Medicaid/Medicare and self-pay.  [facility]  [address], [place] Phone: [phone](336) [phone], Fax: [phone](336) [phone]. Hours of Operation:  8:30 am - 5:30 pm, M-F.  Also accepts Medicaid and self-pay.  [facility] [place] [address], [place] Phone: [phone](336) [phone]   [facility] [address], [place], [place] [phone], Ext. [phone] Mondays & Thursdays: 7-9 AM.  First 15 patients are seen on a first come, first serve basis.    Medicaid-accepting [facility] Providers:  Organization         Address                                                                       Phone                               Notes  [facility] [address], [place] [phone]) [phone] Also accepts self-pay patients.  [facility] [address] [place], [place]  [phone]) [phone]   [facility] [address], [place] [phone]) [phone]   [facility] [address] [place] [address], [place] [phone]) [phone]   Renaye RakersVeita Bland [address], [place]   [phone](336) [phone] Only accepts [place] Access [place] patients after they have their name applied to their card.   Self-Pay (no insurance) in [place]:   Organization         Address                                                     Phone               Notes  Sickle Cell Patients, [facility] [address], [place] [phone]) [phone]   [facility] [address], [place] [phone]) [phone]   [facility] [place]  [address], [place] [phone]) [phone]   [facility]/Dr. Osei-Bonsu  [address] [place] [address], [place] or [phone], [place] [phone]) [phone] Phone number for both [place] and [place] locations is the same.  [facility] [address], [place] [phone]) [phone]   [facility] [place] [place] [address] [place] [address], [place] or [address] [phone]) [phone]   [facility] [address], [place] [phone], phone; [phone], fax Sees patients 1st and 3rd Saturday of every month.  Must not qualify for public or private insurance (i.e. Medicaid, Medicare, [place] Health Choice, Veterans'  Benefits)  Household income should be no more than 200% of the poverty level The clinic cannot treat you if you are pregnant or think you are pregnant  Sexually transmitted diseases are not treated at the clinic.    Dental Care: Organization         Address                                  Phone                       Notes  [facility] Department of [facility] [address] [place], [place] [phone] Accepts children up to age 58 who are enrolled in [place] or [place] Health Choice; pregnant women with a Medicaid card; and children who have applied for Medicaid or [place] Health Choice, but were declined, whose parents can pay a reduced fee at time of service.  [facility] Department of [facility]  [address], [place] [phone] Accepts children up to age 72 who are enrolled in [place] or [place] Health Choice; pregnant women with a Medicaid card; and children who have applied for Medicaid or [place] Health Choice, but were declined, whose parents can pay a reduced fee at time of service.  [facility]  [address] [place], [place] [phone] Patients are seen by appointment only. Walk-ins are not accepted. [facility] will see patients 29 years of age and older. Monday - Tuesday (8am-5pm) Most Wednesdays (8:30-5pm) $30 per visit, cash only  [facility] Adult Dental Access PRO[facility]  [address], [facility] [phone] Patients are seen by appointment only. Walk-ins are not accepted. [facility] will see patients 23 years of age and older. One Wednesday Evening (Monthly: Volunteer Based).  $30 per visit, cash only  [employer]  [phone] for adults; Children under age 40, call Graduate Pediatric Dentistry at [phone]. Children aged 87-14, please call [phone] to request a pediatric application.  Dental services are provided in all areas of dental care including  fillings, crowns and bridges, complete and partial dentures, implants, gum treatment, root canals, and extractions. Preventive care is also provided. Treatment is provided to both adults and children. Patients are selected via a lottery and there is often a waiting list.   [facility] [address], [place]  [phone] [email or web address]   [facility] [address] [place], [place] [phone], Ext. [phone] Second and Fourth Thursday of each month, opens at 6:30 AM; Clinic ends at 9 AM.  Patients are seen on a first-come first-served basis, and a limited number are seen during each clinic.   [facility]  [address] [place] [place], [place] [phone]   Eligibility Requirements You must have lived in [place], [place], or [place] counties for at least the last three months.   You cannot be eligible for state or federal sponsored [place], including [employer], [place], or [employer].   You generally cannot be eligible for healthcare insurance through your employer.    How to apply: Eligibility screenings are held every  Tuesday and Wednesday afternoon from 1:00 pm until 4:00 pm. You do not need an appointment for the interview!  [facility] [address], [place], [place] [phone]   [facility]  [phone]   [facility] Health [facility]  [phone]   [facility]  [phone]    Behavioral Health Resources in the Community: Intensive Outpatient Programs Organization         Address                                              Phone              Notes  [facility] [address], [place], [place] [phone]   [facility]patient [address] [place], [place] [phone]   [facility] [address], [place], [place]  [phone]   [facility] [address]   [place], [place] [phone] or [phone]   Substance Abuse Resources Organization         Address                                Phone  Notes  [facility] Drug Services  [phone]   [facility]  [phone]   The Reader  [phone]   Floydene Flock  [phone]   Residential & Outpatient Substance Abuse Program  [phone]   Psychological Services Organization         Address                                  Phone                Notes  [facility]  [phone]   [facility]  [phone]   [facility] [address] [place] [phone] or [phone]    Mobile Crisis Teams Organization         Address  Phone  Notes  [facility], Mobile Crisis Care Unit  [phone]   [facility]  [address] [place], [place] [phone]   Doristine Locks [address] [place] [phone]    Self-Help/Support Groups Organization         Address                         Phone             Notes  [facility] [place] - variety of support groups  336- I7437963 Call for more information  Narcotics Anonymous (NA), [facility] [address], [employer] [place]  2 meetings at this location   Statistician         Address                                                    Phone              Notes  [facility] [phone]  [address]    [place]  [phone]   [facility]  [address], [place] 712458, [place], [place]   [place], [place] [phone] Admissions: 8am-3pm M-F  Incentives Substance [place] [address],    [place], [place] [phone]   [facility] [address] [place], [place], [place]   [facility] [address]  [place], [place]   [facility] Programs - Intensive  Outpatient [address], [place], [place]   [facility] ([place] [address] [place].,  [place], [place] [phone] or [phone]   [facility] ([facility]) [address] [place], [place] Accepts Medicaid  [facility] [place] [address]  [place] 1-4783114955 Substance Abuse/Addiction Treatment   [facility] Organization         Address                                                            Phone                    Notes  [facility]  [phone]   Domenic Schwab, PhD [address] Arlis Porta [place], [place]   [phone] or [phone]   [place], [place] [phone]   [facility] [address] [place], [place] [phone] Insurance/Medicaid/sponsorship through [facility] and Families [address], [address]                                    [place], [place] [phone] [place] [address], [place] [phone]    Dr. Adele Schilder  [phone]   [facility] Dept. 1) [address] [place] 2) [place] 3)  [place] [address], [place] [phone]  [phone]   [place] [phone] or [phone] (After Hours)

## 2020-05-24 NOTE — ED Triage Notes (Addendum)
Pt was seen in ER at [facility] on 6/10 - discharged to home Pt presents today w/ continued N/V - unable to keep food down  No emesis since arrival to Urgent Care Pt c/o Headache w/ dizziness Pt c/o bloating -had BM on Friday - thinks she may be constipated  Pt states she would like some phenergan

## 2020-05-24 NOTE — ED Provider Notes (Addendum)
Chelsea Grant CARE    CSN: [id number] Arrival date & time: 05/24/20  1508      History   Chief Complaint Chief Complaint  Patient presents with  . Headache  . Emesis    HPI Chelsea Grant is a 59 y.o. female.   HPI Chelsea Grant is a 59 y.o. female presenting to UC with c/o generalized HA with nausea, vomiting and mild dizziness described as room spinning.  Pt was seen at [facility] ER on 6/10 and discharged home after being seen for similar symptoms.  Pt is requesting phenergan today.  Denies fever, chills, or diarrhea. Pt notes she feels bloated and constipated but denies abdominal pain.    Past Medical History:  Diagnosis Date  . Thyroid disease     Patient Active Problem List   Diagnosis Date Noted  . Adjustment disorder with mixed anxiety and depressed mood 05/21/2020  . Bipolar disorder with psychotic features (HCC) 04/20/2019  . Nicotine dependence 02/06/2017  . Alcohol use disorder, severe, dependence (HCC) 11/10/2015  . History of migraine 04/11/2014    History reviewed. No pertinent surgical history.  OB History   No obstetric history on file.      Home Medications    Prior to Admission medications   Medication Sig Start Date End Date Taking? Authorizing Provider  acetaminophen (TYLENOL) 500 MG tablet Take 500 mg by mouth every 6 (six) hours as needed for mild pain.   Yes [provider]  levothyroxine (SYNTHROID) 125 MCG tablet Take 1 tablet (125 mcg total) by mouth daily. 03/18/20 06/16/20 Yes Menshew, Charlesetta Ivory, PA-C  risperiDONE (RISPERDAL) 2 MG tablet Take by mouth. 05/07/20  Yes [provider]  indomethacin (INDOCIN) 50 MG capsule  05/20/20   [provider]  meclizine (ANTIVERT) 25 MG tablet Take 1 tablet (25 mg total) by mouth 2 (two) times daily as needed for dizziness. 05/24/20   Lurene Shadow, PA-C    Family History Family History  Family history unknown: Yes    Social History Social History    Tobacco Use  . Smoking status: Current Every Day Smoker    Packs/day: 1.00    Years: 35.00    Pack years: 35.00    Types: Cigarettes  . Smokeless tobacco: Never Used  Vaping Use  . Vaping Use: Never used  Substance Use Topics  . Alcohol use: Not Currently    Comment: none in 8 months ago  . Drug use: Never     Allergies   Patient has no known allergies.   Review of Systems Review of Systems  Constitutional: Negative for chills and fever.  HENT: Negative for congestion, ear pain, sore throat, trouble swallowing and voice change.   Respiratory: Negative for cough and shortness of breath.   Cardiovascular: Negative for chest pain and palpitations.  Gastrointestinal: Positive for constipation, nausea and vomiting. Negative for abdominal pain and diarrhea.  Musculoskeletal: Negative for arthralgias, back pain and myalgias.  Skin: Negative for rash.  Neurological: Positive for dizziness and headaches. Negative for facial asymmetry, speech difficulty, weakness, light-headedness and numbness.  All other systems reviewed and are negative.    Physical Exam Triage Vital Signs ED Triage Vitals  Enc Vitals Group     BP 05/24/20 1514 110/64     Pulse Rate 05/24/20 1514 (!) 104     Resp 05/24/20 1514 16     Temp --      Temp Source 05/24/20 1514 Oral     SpO2  05/24/20 1514 98 %     Weight --      Height --      Head Circumference --      Peak Flow --      Pain Score 05/24/20 1516 9     Pain Loc --      Pain Edu? --      Excl. in GC? --    No data found.  Updated Vital Signs BP 110/64 (BP Location: Right Arm) Comment: unable to obtain -pt moving arm and talking  Pulse (!) 104   Resp 16   SpO2 98%   Visual Acuity Right Eye Distance:   Left Eye Distance:   Bilateral Distance:    Right Eye Near:   Left Eye Near:    Bilateral Near:     Physical Exam Vitals and nursing note reviewed.  Constitutional:      General: She is not in acute distress.    Appearance:  She is well-developed. She is not ill-appearing, toxic-appearing or diaphoretic.  HENT:     Head: Normocephalic and atraumatic.     Right Ear: Tympanic membrane and ear canal normal.     Left Ear: Tympanic membrane and ear canal normal.     Nose: Nose normal.     Right Sinus: No maxillary sinus tenderness or frontal sinus tenderness.     Left Sinus: No maxillary sinus tenderness or frontal sinus tenderness.     Mouth/Throat:     Lips: Pink.     Mouth: Mucous membranes are moist.     Pharynx: Oropharynx is clear. Uvula midline.  Eyes:     General: No visual field deficit.    Extraocular Movements: Extraocular movements intact.     Pupils: Pupils are equal, round, and reactive to light.  Cardiovascular:     Rate and Rhythm: Normal rate and regular rhythm.  Pulmonary:     Effort: Pulmonary effort is normal. No respiratory distress.     Breath sounds: Normal breath sounds. No stridor. No wheezing, rhonchi or rales.  Musculoskeletal:        General: Normal range of motion.     Cervical back: Normal range of motion and neck supple.  Skin:    General: Skin is warm and dry.  Neurological:     Mental Status: She is alert and oriented to person, place, and time.     GCS: GCS eye subscore is 4. GCS verbal subscore is 5. GCS motor subscore is 6.     Cranial Nerves: No cranial nerve deficit, dysarthria or facial asymmetry.     Sensory: No sensory deficit.     Motor: No weakness.     Coordination: Romberg sign negative. Coordination normal.     Gait: Gait normal.  Psychiatric:        Mood and Affect: Mood normal.        Behavior: Behavior normal.      UC Treatments / Results  Labs (all labs ordered are listed, but only abnormal results are displayed) Labs Reviewed - No data to display  EKG   Radiology No results found.  Procedures Procedures (including critical care time)  Medications Ordered in UC Medications  ketorolac (TORADOL) injection 60 mg (60 mg Intramuscular Given  05/24/20 1605)  metoCLOPramide (REGLAN) injection 5 mg (5 mg Intramuscular Given 05/24/20 1606)  dexamethasone (DECADRON) injection 10 mg (10 mg Intramuscular Given 05/24/20 1607)    Initial Impression / Assessment and Plan / UC Course  I have reviewed  the triage vital signs and the nursing notes.  Pertinent labs & imaging results that were available during my care of the patient were reviewed by me and considered in my medical decision making (see chart for details).     Normal exam.  No vomiting while pt was in UC Pt was given "migraine cocktail" toradol, decadron and reglan. HA improved from 9/10 to 0/10 at time of discharge. Due to reported dizziness, will have pt try meclizine for the dizziness and nausea rather than phenergan.   Pt requested assistance finding a PCP to help with HAs. Resource guide provided Discussed symptoms that warrant emergent care in the ED. AVS given   Final Clinical Impressions(s) / UC Diagnoses   Final diagnoses:  Bad headache  Nausea and vomiting in adult  Dizziness     Discharge Instructions       You may take  acetaminophen every 4-6 hours or in combination with ibuprofen 400-600mg  every 6-8 hours as needed for pain, inflammation, and fever.  Be sure to well hydrated with clear liquids and get at least 8 hours of sleep at night, preferably more while sick.   Please follow up with family medicine this week for recheck of symptoms and ongoing healthcare needs.  Emergency Department Resource Guide 1) Find a Doctor and Pay Out of Pocket Although you won't have to find out who is covered by your insurance plan, it is a good idea to ask around and get recommendations. You will then need to call the office and see if the doctor you have chosen will accept you as a new patient and what types of options they offer for patients who are self-pay. Some doctors offer discounts or will set up payment plans for their patients who do not have insurance,  but you will need to ask so you aren't surprised when you get to your appointment.  2) Contact Your Local Health Department Not all health departments have doctors that can see patients for sick visits, but many do, so it is worth a call to see if yours does. If you don't know where your local health department is, you can check in your phone book. The CDC also has a tool to help you locate your state's health department, and many state websites also have listings of all of their local health departments.  3) Find a Walk-in Clinic If your illness is not likely to be very severe or complicated, you may want to try a walk in clinic. These are popping up all over the country in pharmacies, drugstores, and shopping centers. They're usually staffed by nurse practitioners or physician assistants that have been trained to treat common illnesses and complaints. They're usually fairly quick and inexpensive. However, if you have serious medical issues or chronic medical problems, these are probably not your best option.  No Primary Care Doctor: - Call Health Connect at  [phone] - they can help you locate a primary care doctor that  accepts your insurance, provides certain services, etc. - Physician Referral Service- [phone]  Chronic Pain Problems: Organization         Address  Phone   Notes  [facility]  [phone] Patients need to be referred by their primary care doctor.   Medication Assistance: Organization         Address  Phone   Notes  [facility] Medication [facility] [address] [place] [place], [place] [address] [phone] --Must be a resident  of [place] -- Must have NO insurance coverage whatsoever (no Medicaid/ Medicare, etc.) -- The pt. MUST have a primary care doctor that directs their care regularly and follows them in the community   [facility]  [phone]   [employer]  [phone]    Agencies that provide inexpensive  medical care: Organization         Address                                                       Phone                                                                            Notes  [facility]  [phone]   [facility]    [phone]   [facility] [address] [place], [place] [address] [phone]   [facility] [address] [place], [place] [phone]   Planned Parenthood    [phone]   [facility]    [phone]   Community Health and [facility]  [address], [place] Phone:  [phone], Fax:  [phone] Hours of Operation:  9 am - 6 pm, M-F.  Also accepts Medicaid/Medicare and self-pay.  [facility]  [address], [place] Phone: [phone], Fax: [phone]. Hours of Operation:  8:30 am - 5:30 pm, M-F.  Also accepts Medicaid and self-pay.  [facility] [place] [address] [place] Phone: [phone]   [facility] [address] Natasha Bence [place], [place] [phone], Ext. [phone] Mondays & Thursdays: 7-9 AM.  First 15 patients are seen on a first come, first serve basis.    Medicaid-accepting [facility] Providers:  Organization         Address                                                                       Phone                               Notes  [facility] [address],  [phone] Also accepts self-pay patients.  [facility] [address] Laurell Josephs [place], [place]  [phone]   [facility] [address], [place] [phone]   [facility] [address] [place] [phone]   Renaye Rakers [address], [place]   [phone] Only accepts [place] Access [place] patients after they have their name applied to their card.   Self-Pay  (no insurance) in  [facility]:   Organization         Address                                                     Phone               Notes  Sickle Cell Patients, [facility] Internal Medicine [address] [place], [place] [phone]   [facility] Urgent Care [address] [place], [place] [phone]   [facility] [place]  [address], [place] [phone]   [facility]/Dr. Osei-Bonsu  [address] [place] or [address], [place] [phone] Phone number for both [place] and [place] locations is the same.  [facility]gent Medical and [facility] [address], [place] [phone]   [facility] [address], [place] or [address] [phone]   [facility] [address] [place] [phone], phone; [phone], fax Sees patients 1st and 3rd Saturday of every month.  Must not qualify for public or private insurance (i.e. Medicaid, Medicare, [place] Health Choice, Veterans' Benefits) . Household income should be no more than 200% of the poverty level .The clinic cannot treat you if you are pregnant or think you are pregnant . Sexually transmitted diseases are not treated at the clinic.    Dental Care: Organization         Address                                  Phone                       Notes  [facility] Department of [facility] [address] [place], [place] [phone] Accepts children up to age 40 who are enrolled in [place] or [place] Health Choice; pregnant women with a Medicaid card; and children who have applied for Medicaid or [place] Health Choice, but were declined, whose parents can pay a reduced fee at time of service.  [facility] Department of [facility]  [address], [place] [phone] Accepts children up to age 80 who are enrolled in [place] or [place] Health  Choice; pregnant women with a Medicaid card; and children who have applied for Medicaid or [place] Health Choice, but were declined, whose parents can pay a reduced fee at time of service.  [facility]  [address] [place], [place] [phone] Patients are seen by appointment only. Walk-ins are not accepted. [facility] will see patients 30 years of age and older. Monday - Tuesday (8am-5pm) Most Wednesdays (8:30-5pm) $30 per visit, cash only  [facility] Access [facility]  [address], [facility] [phone] Patients are seen by appointment only. Walk-ins are not accepted. [facility] will see patients 42 years of age and older. One Wednesday Evening (Monthly: Volunteer Based).  $30 per visit, cash only  [employer]  [phone] for adults; Children under age 50, call Graduate Pediatric Dentistry at [phone]. Children aged 40-14, please call [phone] to request a pediatric  application.  Dental services are provided in all areas of dental care including fillings, crowns and bridges, complete and partial dentures, implants, gum treatment, root canals, and extractions. Preventive care is also provided. Treatment is provided to both adults and children. Patients are selected via a lottery and there is often a waiting list.   [facility] [address] [place]  [phone] [email or web address]   [facility] [address] [place], [place] [phone], Ext. [phone] Second and Fourth Thursday of each month, opens at 6:30 AM; Clinic ends at 9 AM.  Patients are seen on a first-come first-served basis, and a limited number are seen during each clinic.   [facility]  [address] Ether Griffins [place], [place] [phone]   Eligibility Requirements You must have lived in [place], [place], or [place] counties for at least the last three months.   You cannot be eligible for state or  federal sponsored [place], including [employer], [place], or [employer].   You generally cannot be eligible for healthcare insurance through your employer.    How to apply: Eligibility screenings are held every Tuesday and Wednesday afternoon from 1:00 pm until 4:00 pm. You do not need an appointment for the interview!  [facility] [address] [place], [place] [phone]   [facility] Health Department  [phone]   [facility] Health Department  [phone]   [facility] Health Department  [phone]    Behavioral Health Resources in the Community: Intensive Outpatient Programs Organization         Address                                              Phone              Notes  [facility] [address] [place], [place] [phone]   [facility]patient [address], [place], [place] [phone]   [facility] [address], [place], [place]  [phone]   [facility] [address]  [place], [place] [phone] or [phone]   Substance Abuse Resources Organization         Address                                Phone  Notes  [facility] Drug Services  [phone]   [facility]  [phone]   [facility] [place]  [phone]   Floydene Flock  [phone]   Residential & Outpatient Substance Abuse Program  [phone]   Psychological Services Organization         Address                                  Phone                Notes  [facility] Behavioral Health  [phone]   [facility] Services  [phone]   [facility] [address] [place] [phone] or [phone]    Mobile Crisis Teams Organization         Address  Phone  Notes  [facility], Mobile Crisis Care Unit  [phone]   Assertive  Psychotherapeutic Services  [address] [place], [place] [phone]     [facility] [address] [facility] [place] [phone]    Self-Help/Support Groups Organization         Address                         Phone             Notes  [facility] [place] - variety of support groups  336- I7437963 Call for more information  Narcotics Anonymous (NA), [facility] [address], [employer] [place]  2 meetings at this location   Statistician         Address                                                    Phone              Notes  [facility] [address],    [place]  [phone]   [facility]  [address] [place] [address], [place], [place] [phone]   [facility] Treatment Facility [address] [place], [place] [phone] Admissions: 8am-3pm M-F  [facility] [address]    [place], [place] [phone]   [facility] [address] [place], [place], [place] [phone]   [facility] [address],  [place], [place] [phone]   [facility] - Intensive Outpatient [address] Laurell Josephs [address], [place], [place] [phone]   [facility] ([facility]iction [facility] [address] [place]  [place], [place] [phone] or [phone]   [facility] ([facility]) [address], [place], [place] [phone] Accepts Medicaid  [facility] [address]  [place] [phone] Substance Abuse/Addiction Treatment   [facility] Organization         Address                                                            Phone                    Notes  [facility]  [phone]   Angie Fava, PhD [address] Ervin Knack [place], [place]   ([phone] or [phone]   [facility]   [address] [place], [place] [phone]   [facility] [address] [place], [place] [phone] Insurance/Medicaid/sponsorship through [facility] [address]                                    [place], [place] [phone] Therapy/tele-psych/case  [facility] [address], [place] [phone]    Dr. Lolly Mustache  [phone]   [facility] [place]  [employer] [facility] Dept. 1) [address],  2) [address] [place] 3)  [address], [place] [phone]  [phone]  [place] [phone] or [phone] (After Hours)           ED Prescriptions    Medication Sig Dispense Auth. Provider   meclizine (ANTIVERT) 25 MG tablet Take 1 tablet (25 mg total) by mouth 2 (two) times daily as needed for dizziness. 6 tablet Noe Gens, PA-C     PDMP not reviewed this encounter.   Noe Gens, PA-C 05/25/20 1301    Noe Gens, PA-C 05/25/20 1302

## 2020-07-07 ENCOUNTER — Other Ambulatory Visit: Payer: Self-pay

## 2020-07-07 ENCOUNTER — Emergency Department
Admission: EM | Admit: 2020-07-07 | Discharge: 2020-07-07 | Disposition: A | Discharge status: Home / Self Care | Payer: Medicare HMO | Source: Home / Self Care | Attending: Family Medicine | Admitting: Family Medicine

## 2020-07-07 ENCOUNTER — Encounter: Payer: Self-pay | Admitting: Emergency Medicine

## 2020-07-07 DIAGNOSIS — H9313 Tinnitus, bilateral: Secondary | ICD-10-CM | POA: Diagnosis not present

## 2020-07-07 DIAGNOSIS — H6121 Impacted cerumen, right ear: Secondary | ICD-10-CM | POA: Diagnosis not present

## 2020-07-07 DIAGNOSIS — M26623 Arthralgia of bilateral temporomandibular joint: Secondary | ICD-10-CM | POA: Diagnosis not present

## 2020-07-07 MED ORDER — AMOXICILLIN 875 MG PO TABS
875.0000 mg | ORAL_TABLET | Freq: Two times a day (BID) | ORAL | 0 refills | Status: DC
Start: 2020-07-07 — End: 2020-07-29

## 2020-07-07 NOTE — ED Triage Notes (Signed)
Tinnitus x 6-8 months, a little pain

## 2020-07-07 NOTE — ED Provider Notes (Signed)
[name]    CSN: [id number] Arrival date & time: 07/07/20  1623      History   Chief Complaint Chief Complaint  Patient presents with  . Tinnitus    HPI Chelsea Grant is a 59 y.o. female.   About 6 months ago patient developed bilateral tinnitus that she describes as a high pitched constant tone (worse in the left ear). She recalls a brief episode of dizziness initially, but no hearing loss.  She denies earache, sinus congestion, URI symptoms, and fever.  The history is provided by the patient.    Past Medical History:  Diagnosis Date  . Thyroid disease     Patient Active Problem List   Diagnosis Date Noted  . Adjustment disorder with mixed anxiety and depressed mood 05/21/2020  . Bipolar disorder with psychotic features (HCC) 04/20/2019  . Nicotine dependence 02/06/2017  . Alcohol use disorder, severe, dependence (HCC) 11/10/2015  . History of migraine 04/11/2014    History reviewed. No pertinent surgical history.  OB History   No obstetric history on file.      Home Medications    Prior to Admission medications   Medication Sig Start Date End Date Taking? Authorizing Provider  acetaminophen (TYLENOL) 500 MG tablet Take 500 mg by mouth every 6 (six) hours as needed for mild pain.    [provider]  amoxicillin (AMOXIL) 875 MG tablet Take 1 tablet (875 mg total) by mouth 2 (two) times daily. 07/07/20   Lattie Haw, MD  levothyroxine (SYNTHROID) 125 MCG tablet Take 1 tablet (125 mcg total) by mouth daily. 03/18/20 06/16/20  Menshew, Charlesetta Ivory, PA-C  risperiDONE (RISPERDAL) 2 MG tablet Take by mouth. 05/07/20   [provider]    Family History Family History  Family history unknown: Yes    Social History Social History   Tobacco Use  . Smoking status: Current Every Day Smoker    Packs/day: 1.00    Years: 35.00    Pack years: 35.00    Types: Cigarettes  . Smokeless tobacco: Never Used  Vaping Use  . Vaping  Use: Never used  Substance Use Topics  . Alcohol use: Not Currently    Comment: none in 8 months ago  . Drug use: Never     Allergies   Codeine   Review of Systems Review of Systems No sore throat No cough No pleuritic pain No wheezing No nasal congestion No post-nasal drainage No sinus pain/pressure No itchy/red eyes No earache No hemoptysis No SOB No fever/chills No nausea No vomiting No abdominal pain No diarrhea No urinary symptoms No skin rash No fatigue No myalgias No headache   Physical Exam Triage Vital Signs ED Triage Vitals  Enc Vitals Group     BP 07/07/20 1635 107/72     Pulse Rate 07/07/20 1635 83     Resp --      Temp 07/07/20 1635 99 F (37.2 C)     Temp Source 07/07/20 1635 Oral     SpO2 07/07/20 1635 99 %     Weight 07/07/20 1637 157 lb (71.2 kg)     Height 07/07/20 1637 5\' 5"  (1.651 m)     Head Circumference --      Peak Flow --      Pain Score 07/07/20 1636 1     Pain Loc --      Pain Edu? --      Excl. in GC? --    No data  found.  Updated Vital Signs BP 107/72 (BP Location: Right Arm)   Pulse 83   Temp 99 F (37.2 C) (Oral)   Ht 5\' 5"  (1.651 m)   Wt 71.2 kg   SpO2 99%   BMI 26.13 kg/m   Visual Acuity Right Eye Distance:   Left Eye Distance:   Bilateral Distance:    Right Eye Near:   Left Eye Near:    Bilateral Near:     Physical Exam Vitals and nursing note reviewed.  Constitutional:      General: She is not in acute distress. HENT:     Head: Normocephalic.     Right Ear: Tympanic membrane, ear canal and external ear normal. There is impacted cerumen.     Left Ear: Tympanic membrane, ear canal and external ear normal. There is no impacted cerumen.     Ears:     Comments: Post lavage, the right canal and tympanic membrane appear normal. There is distinct tenderness over both temporomandibular joints.      Nose: Nose normal.     Mouth/Throat:     Pharynx: Oropharynx is clear.  Eyes:     Conjunctiva/sclera:  Conjunctivae normal.     Pupils: Pupils are equal, round, and reactive to light.  Cardiovascular:     Rate and Rhythm: Normal rate.  Pulmonary:     Effort: Pulmonary effort is normal.  Musculoskeletal:     Cervical back: Neck supple.  Lymphadenopathy:     Cervical: No cervical adenopathy.  Skin:    General: Skin is warm and dry.     Findings: No rash.  Neurological:     Mental Status: She is alert and oriented to person, place, and time.      UC Treatments / Results  Labs (all labs ordered are listed, but only abnormal results are displayed) Labs Reviewed -  Tympanometry:  Right ear tympanogram normal; Left ear tympanogram normal  EKG   Radiology No results found.  Procedures Procedures (including critical care time)  Medications Ordered in UC Medications - No data to display  Initial Impression / Assessment and Plan / UC Course  I have reviewed the triage vital signs and the nursing notes.  Pertinent labs & imaging results that were available during my care of the patient were reviewed by me and considered in my medical decision making (see chart for details).    Will begin empiric amoxicillin. Recommend evaluation by the [facility] (tinnitus clinic).   Final Clinical Impressions(s) / UC Diagnoses   Final diagnoses:  Tinnitus, bilateral  Right ear impacted cerumen  Bilateral temporomandibular joint pain   Discharge Instructions   None    ED Prescriptions    Medication Sig Dispense Auth. Provider   amoxicillin (AMOXIL) 875 MG tablet Take 1 tablet (875 mg total) by mouth 2 (two) times daily. 14 tablet [facility], MD        Lattie Haw, MD 07/11/20 1146

## 2020-07-29 ENCOUNTER — Emergency Department
Admission: EM | Admit: 2020-07-29 | Discharge: 2020-07-29 | Disposition: A | Discharge status: Home / Self Care | Payer: Medicare HMO | Source: Home / Self Care | Attending: Emergency Medicine | Admitting: Emergency Medicine

## 2020-07-29 ENCOUNTER — Other Ambulatory Visit: Payer: Self-pay

## 2020-07-29 DIAGNOSIS — B86 Scabies: Secondary | ICD-10-CM | POA: Diagnosis not present

## 2020-07-29 MED ORDER — PREDNISONE 20 MG PO TABS
20.0000 mg | ORAL_TABLET | Freq: Two times a day (BID) | ORAL | 0 refills | Status: DC
Start: 2020-07-29 — End: 2023-07-11

## 2020-07-29 MED ORDER — PERMETHRIN 5 % EX CREA
TOPICAL_CREAM | CUTANEOUS | 1 refills | Status: DC
Start: 2020-07-29 — End: 2023-07-11

## 2020-07-29 MED ORDER — BETAMETHASONE DIPROPIONATE 0.05 % EX CREA
TOPICAL_CREAM | Freq: Two times a day (BID) | CUTANEOUS | 0 refills | Status: DC
Start: 2020-07-29 — End: 2023-07-11

## 2020-07-29 MED ORDER — METHYLPREDNISOLONE ACETATE 80 MG/ML IJ SUSP
80.0000 mg | Freq: Once | INTRAMUSCULAR | Status: AC
  Administered 2020-07-29: 80 mg via INTRAMUSCULAR

## 2020-07-29 NOTE — ED Provider Notes (Signed)
[name]    CSN: [id number] Arrival date & time: 07/29/20  0808      History   Chief Complaint Chief Complaint  Patient presents with  . Bites    HPI Chelsea Grant is a 59 y.o. female.   HPI Patient complains of very itchy bites all along right arm. Says it started on Saturday.-4 days ago.  Also feeling itching all over her back.  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   She states that she feels very anxious about all this.-She denies depression symptoms or suicidal or homicidal ideation.  Has tried lice kits, hydrocortisone and benedryl cream with no relief. She denies fever or chills or nausea or vomiting or chest pain or shortness of breath.   Past Medical History:  Diagnosis Date  . Thyroid disease     Patient Active Problem List   Diagnosis Date Noted  . Adjustment disorder with mixed anxiety and depressed mood 05/21/2020  . Bipolar disorder with psychotic features (HCC) 04/20/2019  . Nicotine dependence 02/06/2017  . Alcohol use disorder, severe, dependence (HCC) 11/10/2015  . History of migraine 04/11/2014    History reviewed. No pertinent surgical history.  OB History   No obstetric history on file.      Home Medications    Prior to Admission medications   Medication Sig Start Date End Date Taking? Authorizing Provider  acetaminophen (TYLENOL) 500 MG tablet Take 500 mg by mouth every 6 (six) hours as needed for mild pain.    [provider]  betamethasone dipropionate 0.05 % cream Apply topically 2 (two) times daily. To affected areas. For itch. Do not use on face or genitals 07/29/20   Lajean Manes, MD  levothyroxine (SYNTHROID) 125 MCG tablet Take 1 tablet (125 mcg total) by mouth daily. 03/18/20 06/16/20  Menshew, Charlesetta Ivory, PA-C  permethrin (ELIMITE) 5 % cream Apply everywhere from the neck down, then wash off 8 hrs later. If  needed, may repeat in 5-7 days if symptoms persist 07/29/20   Lajean Manes, MD  predniSONE (DELTASONE) 20 MG tablet Take 1 tablet (20 mg total) by mouth 2 (two) times daily with a meal. X 5 days 07/29/20   Lajean Manes, MD  risperiDONE (RISPERDAL) 2 MG tablet Take by mouth. 05/07/20   [provider]    Family History Family History  Family history unknown: Yes    Social History Social History   Tobacco Use  . Smoking status: Current Every Day Smoker    Packs/day: 1.00    Years: 35.00    Pack years: 35.00    Types: Cigarettes  . Smokeless tobacco: Never Used  Vaping Use  . Vaping Use: Never used  Substance Use Topics  . Alcohol use: Not Currently    Comment: none in 8 months ago  . Drug use: Never     Allergies   Codeine   Review of Systems Review of Systems Pertinent items noted in HPI and remainder of comprehensive ROS otherwise negative.   Physical Exam Triage Vital Signs ED Triage Vitals  Enc Vitals Group     BP 07/29/20 0824 123/83     Pulse Rate 07/29/20 0824 96     Resp 07/29/20 0824 18     Temp 07/29/20 0824 98 F (36.7 C)     Temp Source 07/29/20 0824 Oral     SpO2 07/29/20  0824 97 %     Weight --      Height --      Head Circumference --      Peak Flow --      Pain Score 07/29/20 0825 0     Pain Loc --      Pain Edu? --      Excl. in GC? --    No data found.  Updated Vital Signs BP 123/83 (BP Location: Left Arm)   Pulse 96   Temp 98 F (36.7 C) (Oral)   Resp 18   SpO2 97%    Physical Exam Vitals reviewed.  Constitutional:      General: She is not in acute distress.    Appearance: She is well-developed.  HENT:     Head: Normocephalic and atraumatic.  Eyes:     General: No scleral icterus.    Pupils: Pupils are equal, round, and reactive to light.  Cardiovascular:     Rate and Rhythm: Normal rate and regular rhythm.  Pulmonary:     Effort: Pulmonary effort is normal.  Abdominal:     General: There is no distension.    Musculoskeletal:     Cervical back: Normal range of motion and neck supple.  Skin:    General: Skin is warm and dry.     Capillary Refill: Capillary refill takes less than 2 seconds.     Comments: Reddened discrete papules, few areas of excoriation, all along right upper extremity but also right hand and wrist and few papules in the webspace.  Also similar papules on back and upper chest.  Face and scalp is spared.  No nits seen in hair.  Neurological:     Mental Status: She is alert and oriented to person, place, and time.     Cranial Nerves: No cranial nerve deficit.  Psychiatric:        Behavior: Behavior normal.     Comments: Mildly anxious. She is cooperative. No delusions      UC Treatments / Results  Labs (all labs ordered are listed, but only abnormal results are displayed) Labs Reviewed - No data to display  EKG   Radiology No results found.  Procedures Procedures (including critical care time)  Medications Ordered in UC Medications  methylPREDNISolone acetate (DEPO-MEDROL) injection 80 mg (80 mg Intramuscular Given 07/29/20 0852)    Initial Impression / Assessment and Plan / UC Course  I have reviewed the triage vital signs and the nursing notes.  Pertinent labs & imaging results that were available during my care of the patient were reviewed by me and considered in my medical decision making (see chart for details).    Discussed diagnosis of scabies.  These could be other types of insect bites causing papules and itch. Risk benefits alternatives discussed. Depo-Medrol 80 mg IM stat. She agrees with the following plans, as described in discharge instructions. She declined my prescribing antiitch med such as hydroxyzine, and she prefers to use oral Benadryl as it is helped itch a little bit.-Precautions that Benadryl can cause drowsiness.  Other precautions regarding other meds discussed.  Final Clinical Impressions(s) / UC Diagnoses   Final diagnoses:   Scabies     Discharge Instructions     You have itchy bites on your skin, most likely scabies. Please read attached instruction sheet on scabies. Today, we are giving you a cortisone shot to help with inflammation and itch. Other prescription sent to your pharmacy. Be sure to follow carefully  the instructions for permethrin cream as specified on the label. Wash all linens at home. Follow-up with your PCP or dermatologist if no better 1 week    ED Prescriptions    Medication Sig Dispense Auth. Provider   permethrin (ELIMITE) 5 % cream Apply everywhere from the neck down, then wash off 8 hrs later. If needed, may repeat in 5-7 days if symptoms persist 60 g Lajean Manes, MD   betamethasone dipropionate 0.05 % cream Apply topically 2 (two) times daily. To affected areas. For itch. Do not use on face or genitals 45 g Lajean Manes, MD   predniSONE (DELTASONE) 20 MG tablet Take 1 tablet (20 mg total) by mouth 2 (two) times daily with a meal. X 5 days 10 tablet Lajean Manes, MD     PDMP not reviewed this encounter.   Lajean Manes, MD 08/01/20 [phone]

## 2020-07-29 NOTE — Discharge Instructions (Addendum)
You have itchy bites on your skin, most likely scabies. Please read attached instruction sheet on scabies. Today, we are giving you a cortisone shot to help with inflammation and itch. Other prescription sent to your pharmacy. Be sure to follow carefully the instructions for permethrin cream as specified on the label. Wash all linens at home. Follow-up with your PCP or dermatologist if no better 1 week

## 2020-07-29 NOTE — ED Triage Notes (Signed)
Pt c/o bites all RT arm. Says it started on Saturday. Also feeling itching all over her back. 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 Has tried lice kits, hydrocortisone and benedryl cream with no relief.

## 2020-08-05 ENCOUNTER — Emergency Department (INDEPENDENT_AMBULATORY_CARE_PROVIDER_SITE_OTHER)
Admission: EM | Admit: 2020-08-05 | Discharge: 2020-08-05 | Disposition: A | Discharge status: Home / Self Care | Payer: Medicare HMO | Source: Home / Self Care | Attending: Internal Medicine | Admitting: Internal Medicine

## 2020-08-05 ENCOUNTER — Other Ambulatory Visit: Payer: Self-pay

## 2020-08-05 DIAGNOSIS — Z8619 Personal history of other infectious and parasitic diseases: Secondary | ICD-10-CM

## 2020-08-05 DIAGNOSIS — L282 Other prurigo: Secondary | ICD-10-CM

## 2020-08-05 MED ORDER — HYDROXYZINE HCL 10 MG PO TABS
10.0000 mg | ORAL_TABLET | Freq: Three times a day (TID) | ORAL | 0 refills | Status: DC | PRN
Start: 2020-08-05 — End: 2023-07-11

## 2020-08-05 NOTE — ED Triage Notes (Signed)
Patient presents to Urgent Care with complaints of follow up from continued rash since last week, was treated for possible scabies. Patient reports she feels like she is better but is worried the place where she has been staying is having another breakout so she would like a refill of the cream. Would also like her roommate to be treated (roommate is not here). Thinks the dog may have fleas, took the dog to the vet today.

## 2020-08-05 NOTE — Discharge Instructions (Addendum)
It was wonderful to meet you today.  We believe your continued itching is likely residual from your recent rash and have a low concern for reinfection.  I have sent in a medication called hydroxyzine, this will help with itchiness all over. You can continue to use hydrocortisone or the betamethasone in small doses on areas that itch.  Make sure that you roommate seeks evaluation for treatment.  Please follow-up with your primary care provider if it is not improving within the next week.

## 2020-08-05 NOTE — ED Provider Notes (Signed)
[facility]    CSN: [id number] Arrival date & time: 08/05/20  1729      History   Chief Complaint Chief Complaint  Patient presents with  . Rash    HPI Chelsea Grant is a 59 y.o. female presenting for evaluation of rash.  She was recently seen on 8/18 for pruritic bites on her arm and back, suspected to be likely scabies at that time. Given IM Depo-Medrol during visit and discharged home with permethrin, betamethasone cream, and oral prednisone course.  Today, she reports the itchiness and appearance of papules significantly improved, however feels like she is now itching in other areas.  Use permethrin on initial day and then 5 days later, tube is completely finished and is requesting a refill because she is worried about reinfection.  Feels like the bugs are running down her neck and back.  No new rash.  After diagnosis, put all of her clothes and comforter/sheets in a tied up bag for several days. Vacuumed apartment and will be removing old couch from the house.  Roommate has not been treated and will be taking their dog to the vet next week for evaluation of fleas.  She wants to make sure she does not have scabies again or flea infestation.   Past Medical History:  Diagnosis Date  . Thyroid disease     Patient Active Problem List   Diagnosis Date Noted  . Adjustment disorder with mixed anxiety and depressed mood 05/21/2020  . Bipolar disorder with psychotic features (HCC) 04/20/2019  . Nicotine dependence 02/06/2017  . Alcohol use disorder, severe, dependence (HCC) 11/10/2015  . History of migraine 04/11/2014    History reviewed. No pertinent surgical history.  OB History   No obstetric history on file.      Home Medications    Prior to Admission medications   Medication Sig Start Date End Date Taking? Authorizing Provider  predniSONE (DELTASONE) 20 MG tablet Take 1 tablet (20 mg total) by mouth 2 (two) times daily with a meal. X 5 days 07/29/20  Yes  Lajean Manes, MD  acetaminophen (TYLENOL) 500 MG tablet Take 500 mg by mouth every 6 (six) hours as needed for mild pain.    [provider]  betamethasone dipropionate 0.05 % cream Apply topically 2 (two) times daily. To affected areas. For itch. Do not use on face or genitals 07/29/20   Lajean Manes, MD  hydrOXYzine (ATARAX/VISTARIL) 10 MG tablet Take 1 tablet (10 mg total) by mouth 3 (three) times daily as needed for itching. 08/05/20   Allayne Stack, DO  levothyroxine (SYNTHROID) 125 MCG tablet Take 1 tablet (125 mcg total) by mouth daily. 03/18/20 06/16/20  Menshew, Charlesetta Ivory, PA-C  permethrin (ELIMITE) 5 % cream Apply everywhere from the neck down, then wash off 8 hrs later. If needed, may repeat in 5-7 days if symptoms persist 07/29/20   Lajean Manes, MD  risperiDONE [facility]) 2 MG tablet Take by mouth. 05/07/20   [provider]    Family History Family History  Problem Relation Age of Onset  . Cancer Mother     Social History Social History   Tobacco Use  . Smoking status: Current Every Day Smoker    Packs/day: 1.00    Years: 35.00    Pack years: 35.00    Types: Cigarettes  . Smokeless tobacco: Never Used  Vaping Use  . Vaping Use: Never used  Substance Use Topics  . Alcohol use: Yes  Alcohol/week: 7.0 standard drinks    Types: 7 Cans of beer per week  . Drug use: Never     Allergies   Codeine   Review of Systems Review of Systems  Respiratory: Negative for shortness of breath and wheezing.   Cardiovascular: Negative for chest pain.  Skin: Positive for rash.  Psychiatric/Behavioral: The patient is nervous/anxious.     Physical Exam Triage Vital Signs ED Triage Vitals  Enc Vitals Group     BP      Pulse      Resp      Temp      Temp src      SpO2      Weight      Height      Head Circumference      Peak Flow      Pain Score      Pain Loc      Pain Edu?      Excl. in GC?    No data found.  Updated Vital Signs BP  116/83 (BP Location: Right Arm)   Pulse 85   Temp 98.3 F (36.8 C) (Oral)   Resp 18   SpO2 97%   Physical Exam Constitutional:      Appearance: Normal appearance.  HENT:     Mouth/Throat:     Mouth: Mucous membranes are moist.  Pulmonary:     Effort: Pulmonary effort is normal.  Skin:    General: Skin is warm and dry.     Capillary Refill: Capillary refill takes less than 2 seconds.     Comments: Few scattered well-healing pink papules present on left hand and forearm without any tenderness to palpation.  No surrounding erythema or swelling.  No rash seen on posterior neck or back.  No excoriations present.  Neurological:     Mental Status: She is alert.  Psychiatric:     Comments: Anxious mood and affect.      UC Treatments / Results  Labs (all labs ordered are listed, but only abnormal results are displayed) Labs Reviewed - No data to display  EKG   Radiology No results found.  Procedures Procedures (including critical care time)  Medications Ordered in UC Medications - No data to display  Initial Impression / Assessment and Plan / UC Course  I have reviewed the triage vital signs and the nursing notes.  Pertinent labs & imaging results that were available during my care of the patient were reviewed by me and considered in my medical decision making (see chart for details).   59 year old female presenting for evaluation of persistent pruritus following recent scabies diagnosis on 8/18.  Reassuringly rash seems to be healing well s/p permethrin treatment, with no evidence of new lesions or rash within remaining regions of pruritus.  Suspect continued sequela of recent infection, fortunately treatment appears successful.  Provided reassurance.  Rx hydroxyzine TID PRN for itching.  May use betamethasone/hydrocortisone sparingly if local irritation. Roommate should seek care for treatment. F/u with PCP if not improving within the next week or sooner if  worsening.   Final Clinical Impressions(s) / UC Diagnoses   Final diagnoses:  History of scabies  Pruritic rash     Discharge Instructions     It was wonderful to meet you today.  We believe your continued itching is likely residual from your recent rash and have a low concern for reinfection.  I have sent in a medication called hydroxyzine, this will help with itchiness all  over. You can continue to use hydrocortisone or the betamethasone in small doses on areas that itch.  Make sure that you roommate seeks evaluation for treatment.  Please follow-up with your primary care provider if it is not improving within the next week.    ED Prescriptions    Medication Sig Dispense Auth. Provider   hydrOXYzine (ATARAX/VISTARIL) 10 MG tablet Take 1 tablet (10 mg total) by mouth 3 (three) times daily as needed for itching. 20 tablet Allayne Stack, DO     PDMP not reviewed this encounter.   Allayne Stack, DO 08/05/20 1843

## 2021-06-11 ENCOUNTER — Encounter (HOSPITAL_COMMUNITY): Payer: Self-pay

## 2021-06-11 ENCOUNTER — Emergency Department (HOSPITAL_COMMUNITY): Payer: Medicare HMO

## 2021-06-11 ENCOUNTER — Emergency Department (HOSPITAL_COMMUNITY)
Admission: EM | Admit: 2021-06-11 | Discharge: 2021-06-11 | Disposition: A | Discharge status: Home / Self Care | Payer: Medicare HMO | Attending: Emergency Medicine | Admitting: Emergency Medicine

## 2021-06-11 DIAGNOSIS — M25562 Pain in left knee: Secondary | ICD-10-CM | POA: Diagnosis not present

## 2021-06-11 DIAGNOSIS — R140 Abdominal distension (gaseous): Secondary | ICD-10-CM

## 2021-06-11 DIAGNOSIS — R1013 Epigastric pain: Secondary | ICD-10-CM | POA: Diagnosis not present

## 2021-06-11 DIAGNOSIS — F17210 Nicotine dependence, cigarettes, uncomplicated: Secondary | ICD-10-CM | POA: Insufficient documentation

## 2021-06-11 LAB — URINALYSIS, ROUTINE W REFLEX MICROSCOPIC
Bilirubin Urine: NEGATIVE
Glucose, UA: NEGATIVE mg/dL
Ketones, ur: NEGATIVE mg/dL
Nitrite: NEGATIVE
Protein, ur: NEGATIVE mg/dL
Specific Gravity, Urine: 1.011 (ref 1.005–1.030)
pH: 5 (ref 5.0–8.0)

## 2021-06-11 LAB — COMPREHENSIVE METABOLIC PANEL
ALT: 13 U/L (ref 0–44)
AST: 15 U/L (ref 15–41)
Albumin: 4.7 g/dL (ref 3.5–5.0)
Alkaline Phosphatase: 87 U/L (ref 38–126)
Anion gap: 8 (ref 5–15)
BUN: 16 mg/dL (ref 6–20)
CO2: 21 mmol/L — ABNORMAL LOW (ref 22–32)
Calcium: 9.3 mg/dL (ref 8.9–10.3)
Chloride: 106 mmol/L (ref 98–111)
Creatinine, Ser: 0.83 mg/dL (ref 0.44–1.00)
GFR, Estimated: >60 mL/min (ref >60)
Glucose, Bld: 113 mg/dL — ABNORMAL HIGH (ref 70–99)
Potassium: 4 mmol/L (ref 3.5–5.1)
Sodium: 135 mmol/L (ref 135–145)
Total Bilirubin: 1 mg/dL (ref 0.3–1.2)
Total Protein: 7.9 g/dL (ref 6.5–8.1)

## 2021-06-11 LAB — CBC WITH DIFFERENTIAL/PLATELET
Abs Immature Granulocytes: 0.02 10*3/uL (ref 0.00–0.07)
Basophils Absolute: 0.1 10*3/uL (ref 0.0–0.1)
Basophils Relative: 1 %
Eosinophils Absolute: 0.2 10*3/uL (ref 0.0–0.5)
Eosinophils Relative: 2 %
HCT: 44.2 % (ref 36.0–46.0)
Hemoglobin: 15.2 g/dL — ABNORMAL HIGH (ref 12.0–15.0)
Immature Granulocytes: 0 %
Lymphocytes Relative: 29 %
Lymphs Abs: 2.4 10*3/uL (ref 0.7–4.0)
MCH: 33.6 pg (ref 26.0–34.0)
MCHC: 34.4 g/dL (ref 30.0–36.0)
MCV: 97.8 fL (ref 80.0–100.0)
Monocytes Absolute: 0.5 10*3/uL (ref 0.1–1.0)
Monocytes Relative: 6 %
Neutro Abs: 5.2 10*3/uL (ref 1.7–7.7)
Neutrophils Relative %: 62 %
Platelets: 494 10*3/uL — ABNORMAL HIGH (ref 150–400)
RBC: 4.52 MIL/uL (ref 3.87–5.11)
RDW: 12.8 % (ref 11.5–15.5)
WBC: 8.5 10*3/uL (ref 4.0–10.5)
nRBC: 0 % (ref 0.0–0.2)

## 2021-06-11 LAB — LIPASE, BLOOD: Lipase: 28 U/L (ref 11–51)

## 2021-06-11 LAB — PROTIME-INR
INR: 0.9 (ref 0.8–1.2)
Prothrombin Time: 12.5 seconds (ref 11.4–15.2)

## 2021-06-11 MED ORDER — IOHEXOL 300 MG/ML  SOLN
100.0000 mL | Freq: Once | INTRAMUSCULAR | Status: AC | PRN
  Administered 2021-06-11: 100 mL via INTRAVENOUS

## 2021-06-11 NOTE — Discharge Instructions (Signed)
For your abdominal pain and distention, please follow-up with your gastroenterologist and your primary care doctor.  For your knee pain, please schedule an appointment with an orthopedic specialist to discuss further.  Return to ER if you develop worsening pain, vomiting, fever or other new concerning symptom.  For now take Tylenol and Motrin as needed for pain.

## 2021-06-11 NOTE — ED Triage Notes (Signed)
Pt arrived via walk in, c/o knee "popping in and out" causing inability to walk. Pt also c/o vomiting and constipation x1 month with no relief from miralax.

## 2021-06-11 NOTE — ED Provider Notes (Signed)
Emergency Medicine Provider Triage Evaluation Note  Chelsea Grant , a 60 y.o. female  was evaluated in triage.  Pt complains of left knee pain for the past 2 weeks.  Feels like the knee is "popping."  Also reports constipation for 1 month despite taking MiraLAX.  Reports stabbing pain to the middle of her abdomen.  Reports nausea and bloating.  Review of Systems  Positive: Abdominal pain, constipation, left knee pain Negative: Vomiting  Physical Exam  BP (!) 142/87 (BP Location: Left Arm)   Pulse 87   Temp 98.1 F (36.7 C) (Oral)   Resp 20   SpO2 100%  Gen:   Awake, no distress   Resp:  Normal effort  MSK:   Moves extremities without difficulty  Other:  2+ DP pulse noted bilaterally.  Tenderness palpation of the epigastric area  Medical Decision Making  Medically screening exam initiated at 2:32 PM.  Appropriate orders placed.  Kinslee Dalpe was informed that the remainder of the evaluation will be completed by another provider, this initial triage assessment does not replace that evaluation, and the importance of remaining in the ED until their evaluation is complete.  Lab work and imaging ordered   Dietrich Pates, PA-C 06/11/21 1433    Derwood Kaplan, MD 06/11/21 1725

## 2021-06-11 NOTE — ED Provider Notes (Signed)
[facility]-EMERGENCY DEPT Provider Note   CSN: [id number] Arrival date & time: 06/11/21  1409     History Chief Complaint  Patient presents with   Knee Pain   Emesis   Constipation    Chelsea Grant is a 60 y.o. female.  Presents to ER for 2 complaints.  Patient reports that her primary complaint is her left knee pain.  Has been ongoing for the past 3 weeks or so, feels like her bones are grinding, sometimes hears a popping sensation.  States that she does not recall any specific injury recently.  Has been able to bear weight but is difficult.  No swelling to the knee.  No fevers or warmth.  Secondary complaint, patient states that her abdomen has been more swollen, feels distended.  Also having pain, pain is isolated to her upper abdomen, comes and goes, worse at night.  Has had decreased appetite.  Pain is currently mild to moderate.  No alleviating factors identified.  Has history of smoking.  HPI     Past Medical History:  Diagnosis Date   Thyroid disease     Patient Active Problem List   Diagnosis Date Noted   Adjustment disorder with mixed anxiety and depressed mood 05/21/2020   Bipolar disorder with psychotic features (HCC) 04/20/2019   Nicotine dependence 02/06/2017   Alcohol use disorder, severe, dependence (HCC) 11/10/2015   History of migraine 04/11/2014    History reviewed. No pertinent surgical history.   OB History   No obstetric history on file.     Family History  Problem Relation Age of Onset   Cancer Mother     Social History   Tobacco Use   Smoking status: Every Day    Packs/day: 1.00    Years: 35.00    Pack years: 35.00    Types: Cigarettes   Smokeless tobacco: Never  Vaping Use   Vaping Use: Never used  Substance Use Topics   Alcohol use: Yes    Alcohol/week: 7.0 standard drinks    Types: 7 Cans of beer per week   Drug use: Never    Home Medications Prior to Admission medications   Medication Sig Start  Date End Date Taking? Authorizing Provider  acetaminophen (TYLENOL) 500 MG tablet Take 500 mg by mouth every 6 (six) hours as needed for mild pain.    [provider]  betamethasone dipropionate 0.05 % cream Apply topically 2 (two) times daily. To affected areas. For itch. Do not use on face or genitals 07/29/20   Lajean Manes, MD  hydrOXYzine (ATARAX/VISTARIL) 10 MG tablet Take 1 tablet (10 mg total) by mouth 3 (three) times daily as needed for itching. 08/05/20   Allayne Stack, DO  levothyroxine (SYNTHROID) 125 MCG tablet Take 1 tablet (125 mcg total) by mouth daily. 03/18/20 06/16/20  Menshew, Charlesetta Ivory, PA-C  permethrin (ELIMITE) 5 % cream Apply everywhere from the neck down, then wash off 8 hrs later. If needed, may repeat in 5-7 days if symptoms persist 07/29/20   Lajean Manes, MD  predniSONE (DELTASONE) 20 MG tablet Take 1 tablet (20 mg total) by mouth 2 (two) times daily with a meal. X 5 days 07/29/20   Lajean Manes, MD  risperiDONE (RISPERDAL) 2 MG tablet Take by mouth. 05/07/20   [provider]    Allergies    Codeine  Review of Systems   Review of Systems  Constitutional:  Negative for chills and fever.  HENT:  Negative for  ear pain and sore throat.   Eyes:  Negative for pain and visual disturbance.  Respiratory:  Negative for cough and shortness of breath.   Cardiovascular:  Negative for chest pain and palpitations.  Gastrointestinal:  Positive for abdominal pain and nausea. Negative for vomiting.  Genitourinary:  Negative for dysuria and hematuria.  Musculoskeletal:  Positive for arthralgias. Negative for back pain.  Skin:  Negative for color change and rash.  Neurological:  Negative for seizures and syncope.  All other systems reviewed and are negative.  Physical Exam Updated Vital Signs BP 95/69   Pulse 65   Temp 98.1 F (36.7 C) (Oral)   Resp 18   SpO2 96%   Physical Exam Vitals and nursing note reviewed.  Constitutional:      General: She  is not in acute distress.    Appearance: She is well-developed.  HENT:     Head: Normocephalic and atraumatic.  Eyes:     Conjunctiva/sclera: Conjunctivae normal.  Cardiovascular:     Rate and Rhythm: Normal rate and regular rhythm.     Heart sounds: No murmur heard. Pulmonary:     Effort: Pulmonary effort is normal. No respiratory distress.     Breath sounds: Normal breath sounds.  Abdominal:     Comments: Abdomen is soft, there is mild distention appreciated, some tenderness in the epigastric region but no rebound or guarding  Musculoskeletal:     Cervical back: Neck supple.     Comments: Left lower extremity: There is mild tenderness noted to the knee, no deformity appreciated, no redness or ecchymosis, no swelling, normal joint ROM, distal sensation and motor and DP/PT pulse intact  Skin:    General: Skin is warm and dry.  Neurological:     General: No focal deficit present.     Mental Status: She is alert.    ED Results / Procedures / Treatments   Labs (all labs ordered are listed, but only abnormal results are displayed) Labs Reviewed  COMPREHENSIVE METABOLIC PANEL - Abnormal; Notable for the following components:      Result Value   CO2 21 (*)    Glucose, Bld 113 (*)    All other components within normal limits  CBC WITH DIFFERENTIAL/PLATELET - Abnormal; Notable for the following components:   Hemoglobin 15.2 (*)    Platelets 494 (*)    All other components within normal limits  URINALYSIS, ROUTINE W REFLEX MICROSCOPIC - Abnormal; Notable for the following components:   APPearance HAZY (*)    Hgb urine dipstick SMALL (*)    Leukocytes,Ua SMALL (*)    Bacteria, UA RARE (*)    All other components within normal limits  LIPASE, BLOOD  PROTIME-INR    EKG None  Radiology CT ABDOMEN PELVIS W CONTRAST  Result Date: 06/11/2021 CLINICAL DATA:  Epigastric pain and abdominal bloating. Vomiting and constipation for 1 month. EXAM: CT ABDOMEN AND PELVIS WITH CONTRAST  TECHNIQUE: Multidetector CT imaging of the abdomen and pelvis was performed using the standard protocol following bolus administration of intravenous contrast. CONTRAST:  OMNIPAQUE IOHEXOL 300 MG/ML  SOLN COMPARISON:  None. FINDINGS: Lower chest: Linear atelectasis within both lower lobes and lingula. The heart is normal in size. Small to moderate hiatal hernia. Hepatobiliary: No focal hepatic lesion or hepatic abnormality. Partially distended gallbladder. Phrygian cap. No calcified gallstone or pericholecystic inflammation. No biliary dilatation. Pancreas: No ductal dilatation or inflammation. No evidence of pancreatic mass. Spleen: Normal in size without focal abnormality. Adrenals/Urinary Tract: Normal  adrenal glands. No hydronephrosis or perinephric edema. Slightly lobulated renal contours. Homogeneous renal enhancement with symmetric excretion on delayed phase imaging. No visualized renal calculi. Subcentimeter hypodensity in the upper left kidney is too small to characterize. Urinary bladder is partially distended without wall thickening. Stomach/Bowel: Small to moderate hiatal hernia otherwise unremarkable stomach. Normal positioning of the duodenum and ligament of Treitz. No small bowel obstruction or inflammation. Normal appendix. Small to moderate volume of formed stool throughout the colon. Sigmoid colon is mildly redundant. No colonic wall thickening or inflammation. Vascular/Lymphatic: Mild aortic atherosclerosis. No aortic aneurysm. Circumaortic left renal vein. Patent portal vein. No enlarged lymph nodes in the abdomen or pelvis Reproductive: Retroverted uterus.  No adnexal mass Other: Tiny fat containing umbilical hernia. No free air or ascites. No abdominopelvic fluid collection. Scattered soft tissue gluteal granulomas. Musculoskeletal: There are no acute or suspicious osseous abnormalities. Mild scoliosis and degenerative change in the spine. IMPRESSION: 1. No acute abnormality in the  abdomen/pelvis. 2. Small to moderate hiatal hernia. Aortic Atherosclerosis (ICD10-I70.0). Electronically Signed   By: Narda Rutherford M.D.   On: 06/11/2021 19:02   DG Knee Complete 4 Views Left  Result Date: 06/11/2021 CLINICAL DATA:  Left knee pain. EXAM: LEFT KNEE - COMPLETE 4+ VIEW COMPARISON:  None. FINDINGS: No evidence of fracture, dislocation, or joint effusion. No evidence of arthropathy or other focal bone abnormality. Soft tissues are unremarkable. IMPRESSION: Negative. Electronically Signed   By: Lupita Raider M.D.   On: 06/11/2021 15:19    Procedures Procedures   Medications Ordered in ED Medications  iohexol (OMNIPAQUE) 300 MG/ML solution 100 mL (100 mLs Intravenous Contrast Given 06/11/21 1813)    ED Course  I have reviewed the triage vital signs and the nursing notes.  Pertinent labs & imaging results that were available during my care of the patient were reviewed by me and considered in my medical decision making (see chart for details).    MDM Rules/Calculators/A&P                          59 year old lady presents to ER with 2 complaints.  Regarding her knee pain, atraumatic, no obvious physical exam abnormalities, neurovascularly intact, x-rays negative.  Suspect MSK strain.  Recommend follow-up with an orthopedic specialist.  Regarding her abdominal discomfort and distention, check basic labs which were all grossly stable.  CT scan ordered and negative for any acute pathology.  LFTs were within normal limits, INR within normal limits.  No evidence for liver dysfunction, no ascites.  Given his reassuring work-up, believe patient is appropriate for discharge and outpatient management.  I recommend that she follow-up with her primary doctor and her gastroenterologist regarding her abdominal complaint.  Reviewed return precautions and discharged.    After the discussed management above, the patient was determined to be safe for discharge.  The patient was in agreement with  this plan and all questions regarding their care were answered.  ED return precautions were discussed and the patient will return to the ED with any significant worsening of condition.  Final Clinical Impression(s) / ED Diagnoses Final diagnoses:  Acute pain of left knee  Epigastric pain  Abdominal distention    Rx / DC Orders ED Discharge Orders     None        Milagros Loll, MD 06/12/21 1510

## 2021-07-01 ENCOUNTER — Encounter: Payer: Self-pay | Admitting: Family Medicine

## 2021-07-01 ENCOUNTER — Emergency Department (INDEPENDENT_AMBULATORY_CARE_PROVIDER_SITE_OTHER): Payer: Medicare HMO

## 2021-07-01 ENCOUNTER — Emergency Department
Admission: EM | Admit: 2021-07-01 | Discharge: 2021-07-01 | Disposition: A | Discharge status: Home / Self Care | Payer: Medicare HMO | Source: Home / Self Care

## 2021-07-01 DIAGNOSIS — R059 Cough, unspecified: Secondary | ICD-10-CM

## 2021-07-01 DIAGNOSIS — R4182 Altered mental status, unspecified: Secondary | ICD-10-CM

## 2021-07-01 HISTORY — DX: Unspecified psychosis not due to a substance or known physiological condition: F29

## 2021-07-01 MED ORDER — BENZONATATE 200 MG PO CAPS: 200.0000 mg | ORAL_CAPSULE | Freq: Three times a day (TID) | ORAL | 0 refills | Status: AC | PRN

## 2021-07-01 NOTE — ED Provider Notes (Signed)
[name]    CSN: [id number] Arrival date & time: 07/01/21  1006      History   Chief Complaint Chief Complaint  Patient presents with   Cough   Altered Mental Status    HPI Chelsea Grant is a 60 y.o. female.   HPI 60 year old female presents with cough on and off since April of this year.  Patient is concerned about the possibility of bronchitis and is nauseated in triage this morning.  Patient is not vaccinated for COVID-19 reports that Chelsea Grant lives alone and has memory issues-reports Chelsea Grant cannot recall year or day.  PMH difficult for adjustment disorder with mixed anxiety/depressed mood, bipolar disorder with psychotic features, alcohol use disorder, severe dependence, and nicotine dependence.  Patient reports currently smoking 1 pack/day with 26-year pack history.  Patient reports currently not taking any of her prescribed medication(s).  Past Medical History:  Diagnosis Date   Psychosis [facility])    Thyroid disease     Patient Active Problem List   Diagnosis Date Noted   Adjustment disorder with mixed anxiety and depressed mood 05/21/2020   Bipolar disorder with psychotic features (HCC) 04/20/2019   Nicotine dependence 02/06/2017   Alcohol use disorder, severe, dependence (HCC) 11/10/2015   History of migraine 04/11/2014   Psychosis (HCC) 03/17/2011    History reviewed. No pertinent surgical history.  OB History   No obstetric history on file.      Home Medications    Prior to Admission medications   Medication Sig Start Date End Date Taking? Authorizing Provider  benzonatate (TESSALON) 200 MG capsule Take 1 capsule (200 mg total) by mouth 3 (three) times daily as needed for up to 7 days for cough. 07/01/21 07/08/21 Yes Jarvis Sawa, Casimiro Needle, FNP  busPIRone (BUSPAR) 10 MG tablet Take 1 tablet by mouth 2 (two) times daily as needed. 09/29/20  Yes [provider]  levothyroxine (SYNTHROID) 100 MCG tablet Take by mouth. 02/17/21  Yes [provider]  promethazine (PHENERGAN) 25 MG tablet Take by mouth. 05/10/21  Yes [provider]  acetaminophen (TYLENOL) 500 MG tablet Take 500 mg by mouth every 6 (six) hours as needed for mild pain.    [provider]  betamethasone dipropionate 0.05 % cream Apply topically 2 (two) times daily. To affected areas. For itch. Do not use on face or genitals 07/29/20   Lajean Manes, MD  hydrOXYzine (ATARAX/VISTARIL) 10 MG tablet Take 1 tablet (10 mg total) by mouth 3 (three) times daily as needed for itching. Patient not taking: Reported on 07/01/2021 08/05/20   Allayne Stack, DO  levothyroxine (SYNTHROID) 125 MCG tablet Take 1 tablet (125 mcg total) by mouth daily. 03/18/20 06/16/20  Menshew, Charlesetta Ivory, PA-C  permethrin (ELIMITE) 5 % cream Apply everywhere from the neck down, then wash off 8 hrs later. If needed, may repeat in 5-7 days if symptoms persist 07/29/20   Lajean Manes, MD  predniSONE (DELTASONE) 20 MG tablet Take 1 tablet (20 mg total) by mouth 2 (two) times daily with a meal. X 5 days Patient not taking: Reported on 07/01/2021 07/29/20   Lajean Manes, MD  risperiDONE (RISPERDAL) 2 MG tablet Take by mouth. 05/07/20   [provider]    Family History Family History  Problem Relation Age of Onset   Cancer Mother     Social History Social History   Tobacco Use   Smoking status: Every Day    Packs/day: 1.00    Years: 35.00  Pack years: 35.00    Types: Cigarettes   Smokeless tobacco: Never  Vaping Use   Vaping Use: Never used  Substance Use Topics   Alcohol use: Yes    Alcohol/week: 7.0 standard drinks    Types: 7 Cans of beer per week   Drug use: Never     Allergies   Codeine   Review of Systems Review of Systems  Respiratory:  Positive for cough.   All other systems reviewed and are negative.   Physical Exam Triage Vital Signs ED Triage Vitals  Enc Vitals Group     BP 07/01/21 1024 116/79     Pulse Rate 07/01/21 1024 67     Resp  07/01/21 1024 18     Temp --      Temp Source 07/01/21 1024 Tympanic     SpO2 07/01/21 1024 96 %     Weight --      Height 07/01/21 1029 5\' 5"  (1.651 m)     Head Circumference --      Peak Flow --      Pain Score 07/01/21 1028 4     Pain Loc --      Pain Edu? --      Excl. in GC? --    No data found.  Updated Vital Signs BP 116/79 (BP Location: Right Arm)   Pulse 67   Temp 98.9 F (37.2 C) (Oral)   Resp 18   Ht 5\' 5"  (1.651 m)   SpO2 96%   BMI 26.13 kg/m   Physical Exam Vitals and nursing note reviewed.  Constitutional:      General: Chelsea Grant is not in acute distress.    Appearance: Normal appearance. Chelsea Grant is ill-appearing.  HENT:     Head: Normocephalic and atraumatic.     Right Ear: Tympanic membrane, ear canal and external ear normal.     Left Ear: Tympanic membrane, ear canal and external ear normal.     Nose: Nose normal.     Mouth/Throat:     Mouth: Mucous membranes are moist.     Pharynx: Oropharynx is clear.  Eyes:     Extraocular Movements: Extraocular movements intact.     Conjunctiva/sclera: Conjunctivae normal.     Pupils: Pupils are equal, round, and reactive to light.  Cardiovascular:     Rate and Rhythm: Normal rate and regular rhythm.     Pulses: Normal pulses.     Heart sounds: Normal heart sounds. No murmur heard. Pulmonary:     Effort: Respiratory distress present.     Breath sounds: No wheezing or rales.     Comments: Mild diffuse scattered rhonchi noted throughout, decreased breath sounds bibasilarly Musculoskeletal:        General: Normal range of motion.  Skin:    General: Skin is warm and dry.  Neurological:     General: No focal deficit present.     Mental Status: Chelsea Grant is alert. Chelsea Grant is disoriented.  Psychiatric:        Mood and Affect: Mood normal.        Behavior: Behavior normal.     UC Treatments / Results  Labs (all labs ordered are listed, but only abnormal results are displayed) Labs Reviewed - No data to  display  EKG   Radiology DG Chest 2 View  Result Date: 07/01/2021 CLINICAL DATA:  Cough. EXAM: CHEST - 2 VIEW COMPARISON:  None. FINDINGS: The lungs are clear without focal pneumonia, edema, pneumothorax or pleural effusion. The  cardiopericardial silhouette is within normal limits for size. The visualized bony structures of the thorax show no acute abnormality. IMPRESSION: No active cardiopulmonary disease. Electronically Signed   By: Kennith Center M.D.   On: 07/01/2021 11:16    Procedures Procedures (including critical care time)  Medications Ordered in UC Medications - No data to display  Initial Impression / Assessment and Plan / UC Course  I have reviewed the triage vital signs and the nursing notes.  Pertinent labs & imaging results that were available during my care of the patient were reviewed by me and considered in my medical decision making (see chart for details).     MDM: 1. Cough-CXR clear, Rx'd Tessalon Perles, 2.  Altered mental status-given patient's past medical history of mental health diagnosis advised/encourage patient to follow-up with mental health urgent care in [place], [place] patient.  Discharged home, hemodynamically stable Final Clinical Impressions(s) / UC Diagnoses   Final diagnoses:  Cough  Altered mental status, unspecified altered mental status type     Discharge Instructions      Advised patient to take medication as directed.  Advised/encouraged patient to decrease amount of cigarettes smoked per day.  Advised/encourage patient to follow-up with mental health urgent care in [place], [place] for further evaluation.     ED Prescriptions     Medication Sig Dispense Auth. Provider   benzonatate (TESSALON) 200 MG capsule Take 1 capsule (200 mg total) by mouth 3 (three) times daily as needed for up to 7 days for cough. 30 capsule Trevor Iha, FNP      PDMP not reviewed this encounter.   Trevor Iha, FNP 07/01/21 1128

## 2021-07-01 NOTE — Discharge Instructions (Addendum)
Advised patient to take medication as directed.  Advised/encouraged patient to decrease amount of cigarettes smoked per day.  Advised/encourage patient to follow-up with mental health urgent care in [place], [place] for further evaluation.

## 2021-07-01 NOTE — ED Triage Notes (Signed)
Cough on & off since April 2022 Pt is concerned about bronchitis Nauseated in triage  No COVID vaccine  Pt lives alone  Pt is having memory issues per pt  Can not recall year or day

## 2021-09-02 ENCOUNTER — Emergency Department (HOSPITAL_COMMUNITY)
Admission: EM | Admit: 2021-09-02 | Discharge: 2021-09-02 | Disposition: A | Discharge status: Home / Self Care | Payer: Medicare HMO | Attending: Emergency Medicine | Admitting: Emergency Medicine

## 2021-09-02 ENCOUNTER — Encounter (HOSPITAL_COMMUNITY): Payer: Self-pay

## 2021-09-02 ENCOUNTER — Other Ambulatory Visit: Payer: Self-pay

## 2021-09-02 DIAGNOSIS — F1490 Cocaine use, unspecified, uncomplicated: Secondary | ICD-10-CM

## 2021-09-02 DIAGNOSIS — F17210 Nicotine dependence, cigarettes, uncomplicated: Secondary | ICD-10-CM | POA: Diagnosis not present

## 2021-09-02 DIAGNOSIS — M791 Myalgia, unspecified site: Secondary | ICD-10-CM | POA: Insufficient documentation

## 2021-09-02 DIAGNOSIS — F1420 Cocaine dependence, uncomplicated: Secondary | ICD-10-CM | POA: Insufficient documentation

## 2021-09-02 NOTE — ED Provider Notes (Signed)
[facility]-EMERGENCY DEPT Provider Note   CSN: [id number] Arrival date & time: 09/02/21  1932     History Chief Complaint  Patient presents with   Detox    Chelsea Grant is a 60 y.o. female with history of alcohol use disorder, nicotine dependence, bipolar disorder, adjustment disorder.  Presents emergency department with chief complaint of requesting detox.  Patient reports that she has been using crack cocaine and Percocet pain medication.  Patient reports that Percocet pain medication was prescribed to her mother.  Patient reports that she last used crack cocaine and Percocet on Tuesday.  Patient reports that she has been taking this medication to self medicate for generalized pain throughout her entire body.  This pain started in April, got worse in May, and has been constant since then.  Patient reports that "it feels like electric shocks all throughout my body."  Patient denies any SI, HI, AVH.  HPI     Past Medical History:  Diagnosis Date   Psychosis [facility])    Thyroid disease     Patient Active Problem List   Diagnosis Date Noted   Adjustment disorder with mixed anxiety and depressed mood 05/21/2020   Bipolar disorder with psychotic features (HCC) 04/20/2019   Nicotine dependence 02/06/2017   Alcohol use disorder, severe, dependence (HCC) 11/10/2015   History of migraine 04/11/2014   Psychosis (HCC) 03/17/2011    History reviewed. No pertinent surgical history.   OB History   No obstetric history on file.     Family History  Problem Relation Age of Onset   Cancer Mother     Social History   Tobacco Use   Smoking status: Every Day    Packs/day: 1.00    Years: 35.00    Pack years: 35.00    Types: Cigarettes   Smokeless tobacco: Never  Vaping Use   Vaping Use: Never used  Substance Use Topics   Alcohol use: Yes    Alcohol/week: 7.0 standard drinks    Types: 7 Cans of beer per week   Drug use: Never    Home  Medications Prior to Admission medications   Medication Sig Start Date End Date Taking? Authorizing Provider  acetaminophen (TYLENOL) 500 MG tablet Take 500 mg by mouth every 6 (six) hours as needed for mild pain.    [provider]  betamethasone dipropionate 0.05 % cream Apply topically 2 (two) times daily. To affected areas. For itch. Do not use on face or genitals 07/29/20   Lajean Manes, MD  busPIRone (BUSPAR) 10 MG tablet Take 1 tablet by mouth 2 (two) times daily as needed. 09/29/20   [provider]  hydrOXYzine (ATARAX/VISTARIL) 10 MG tablet Take 1 tablet (10 mg total) by mouth 3 (three) times daily as needed for itching. Patient not taking: Reported on 07/01/2021 08/05/20   Allayne Stack, DO  levothyroxine (SYNTHROID) 100 MCG tablet Take by mouth. 02/17/21   [provider]  levothyroxine (SYNTHROID) 125 MCG tablet Take 1 tablet (125 mcg total) by mouth daily. 03/18/20 06/16/20  Menshew, Charlesetta Ivory, PA-C  permethrin (ELIMITE) 5 % cream Apply everywhere from the neck down, then wash off 8 hrs later. If needed, may repeat in 5-7 days if symptoms persist 07/29/20   Lajean Manes, MD  predniSONE (DELTASONE) 20 MG tablet Take 1 tablet (20 mg total) by mouth 2 (two) times daily with a meal. X 5 days Patient not taking: Reported on 07/01/2021 07/29/20   Lajean Manes, MD  promethazine (PHENERGAN) 25 MG tablet Take by mouth. 05/10/21   [provider]  risperiDONE (RISPERDAL) 2 MG tablet Take by mouth. 05/07/20   [provider]    Allergies    Codeine  Review of Systems   Review of Systems  Constitutional:  Negative for chills and fever.  Eyes:  Negative for visual disturbance.  Respiratory:  Negative for shortness of breath.   Cardiovascular:  Negative for chest pain.  Gastrointestinal:  Negative for abdominal pain, nausea and vomiting.  Genitourinary:  Negative for difficulty urinating.  Musculoskeletal:  Positive for arthralgias (Chronic knee  pain bilaterally) and myalgias. Negative for back pain and neck pain.  Skin:  Negative for color change and rash.  Neurological:  Negative for dizziness, syncope, light-headedness and headaches.  Psychiatric/Behavioral:  Negative for confusion.    Physical Exam Updated Vital Signs BP 111/79 (BP Location: Right Arm)   Pulse 83   Temp 98.5 F (36.9 C) (Oral)   Resp 18   SpO2 93%   Physical Exam Vitals and nursing note reviewed.  Constitutional:      General: She is not in acute distress.    Appearance: She is not ill-appearing, toxic-appearing or diaphoretic.  HENT:     Head: Normocephalic.  Eyes:     General: No scleral icterus.       Right eye: No discharge.        Left eye: No discharge.  Cardiovascular:     Rate and Rhythm: Normal rate.  Pulmonary:     Effort: Pulmonary effort is normal. No tachypnea, bradypnea or respiratory distress.  Abdominal:     General: Abdomen is protuberant. There is no distension. There are no signs of injury.     Palpations: Abdomen is soft. There is no mass or pulsatile mass.     Tenderness: There is no abdominal tenderness. There is no guarding or rebound.  Skin:    General: Skin is warm and dry.  Neurological:     General: No focal deficit present.     Mental Status: She is alert.  Psychiatric:        Attention and Perception: She is attentive. She does not perceive auditory or visual hallucinations.        Mood and Affect: Affect is tearful.        Behavior: Behavior is cooperative.        Thought Content: Thought content does not include homicidal or suicidal ideation. Thought content does not include homicidal or suicidal plan.    ED Results / Procedures / Treatments   Labs (all labs ordered are listed, but only abnormal results are displayed) Labs Reviewed - No data to display  EKG None  Radiology No results found.  Procedures Procedures   Medications Ordered in ED Medications - No data to display  ED Course  I have  reviewed the triage vital signs and the nursing notes.  Pertinent labs & imaging results that were available during my care of the patient were reviewed by me and considered in my medical decision making (see chart for details).    MDM Rules/Calculators/A&P                           Alert 60 year old female no acute distress, nontoxic appearing.  Presents to ED with chief complaint of requesting detox from crack cocaine and Percocet use.  Endorses generalized pain throughout her entire body.  This pain started in April, got worse  in May, has been constant since then.  Patient denies any acute injury or illness.  Patient denies any SI, HI, AVH.  We will give patient resources for outpatient counseling.  Patient given information for behavioral health urgent care as needed.  Discussed results, findings, treatment and follow up. Patient advised of return precautions. Patient verbalized understanding and agreed with plan.   Final Clinical Impression(s) / ED Diagnoses Final diagnoses:  Myalgia  Crack cocaine use    Rx / DC Orders ED Discharge Orders     None        Berneice Heinrich 09/02/21 2151    Koleen Distance, MD 09/02/21 2242

## 2021-09-02 NOTE — Discharge Instructions (Addendum)
You came to the emergency department today requesting detox from crack cocaine and Percocet use.  Unfortunately we do not provide detox services here in the emergency department.  I have given you resources to follow-up with in the outpatient setting.  Please follow-up with your primary care provider for your chronic knee pain, and chronic generalized pain.  Please return to the emergency department or behavioral health urgent care for suicidal thoughts, homicidal thoughts, auditory hallucinations, or visual hallucinations.

## 2021-09-02 NOTE — ED Triage Notes (Signed)
Pt states that she has been smoking crack and taking Percocets (from her deceased mom's prescription) for her generalized pain that she has constantly. Pt states that she wants to be clean from doing drugs.

## 2021-12-05 IMAGING — CR DG KNEE COMPLETE 4+V*L*
4 series · 4 of 4 positions shown · non-contrast
Comparison: None.

CLINICAL DATA: Left knee pain.

EXAM:
LEFT KNEE - COMPLETE 4+ VIEW

[t knee ap left]
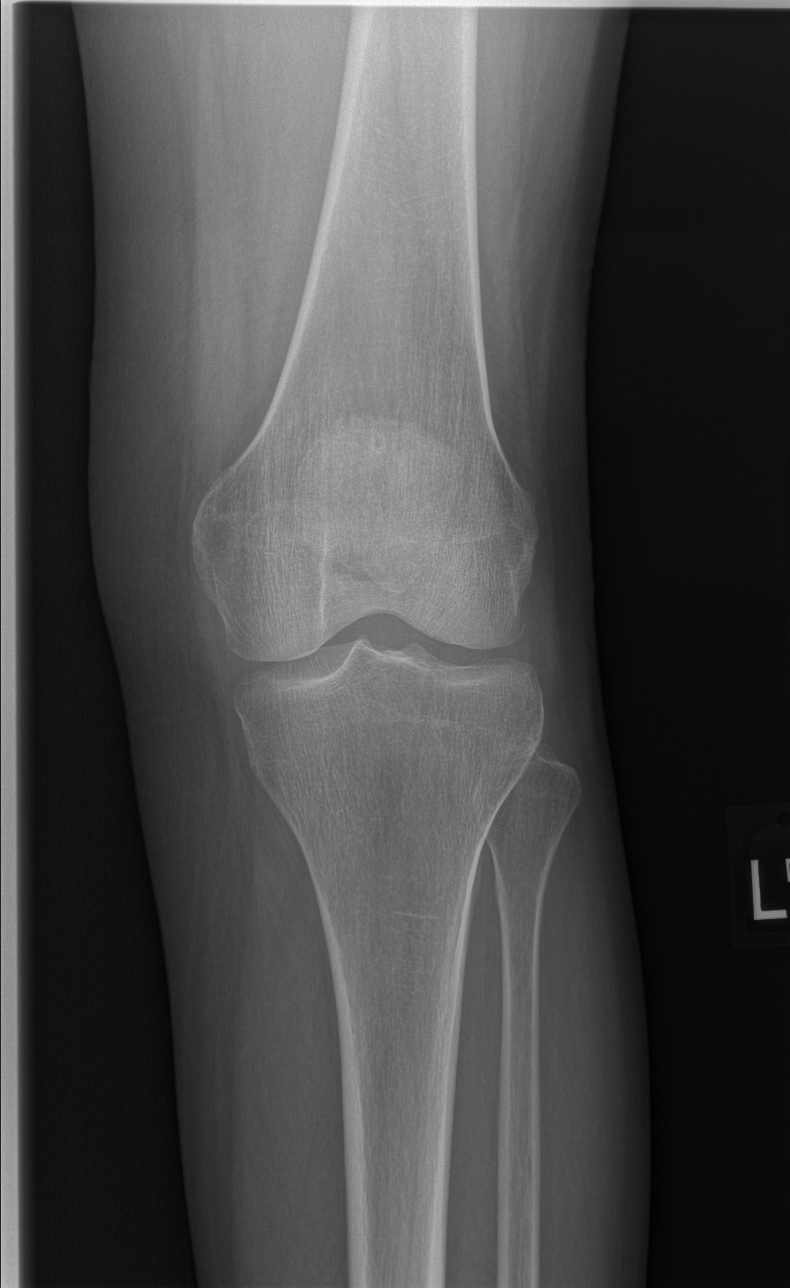

[t knee obl left (1 of 2)]
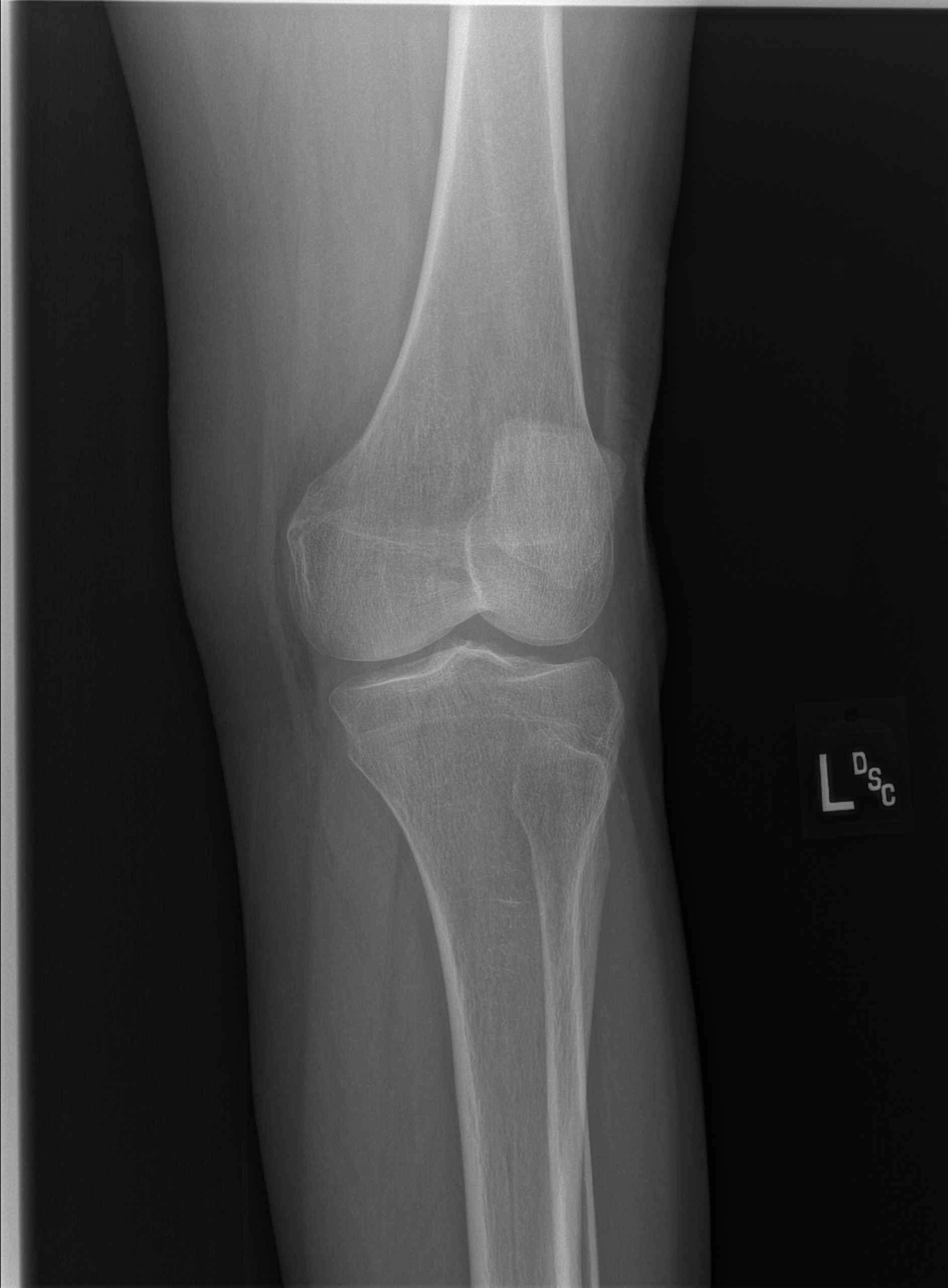

[t knee obl left (2 of 2)]
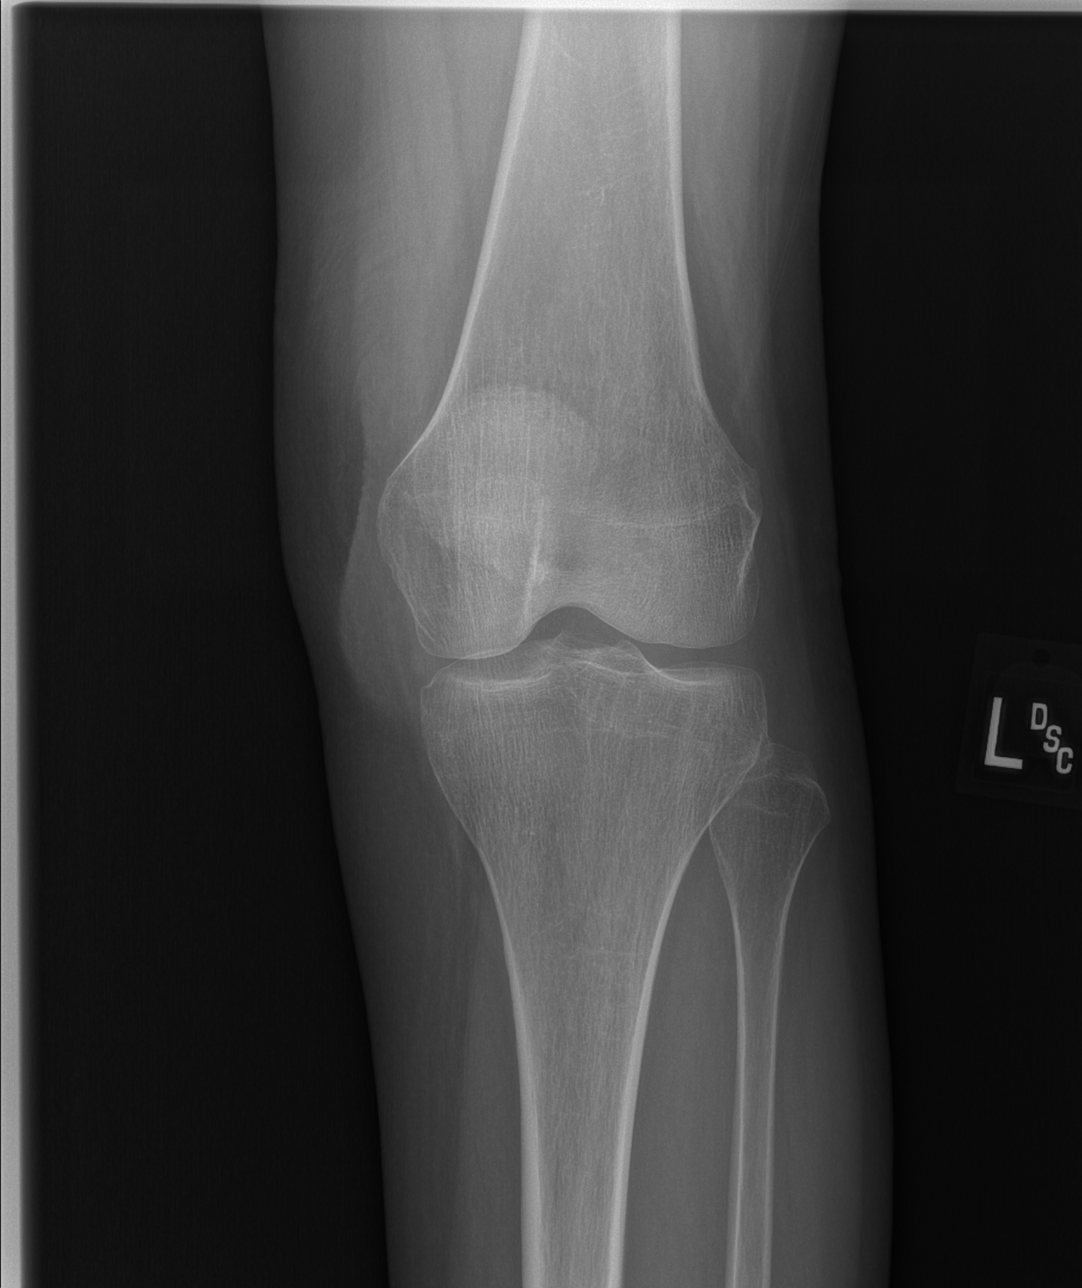

[t knee lat left]
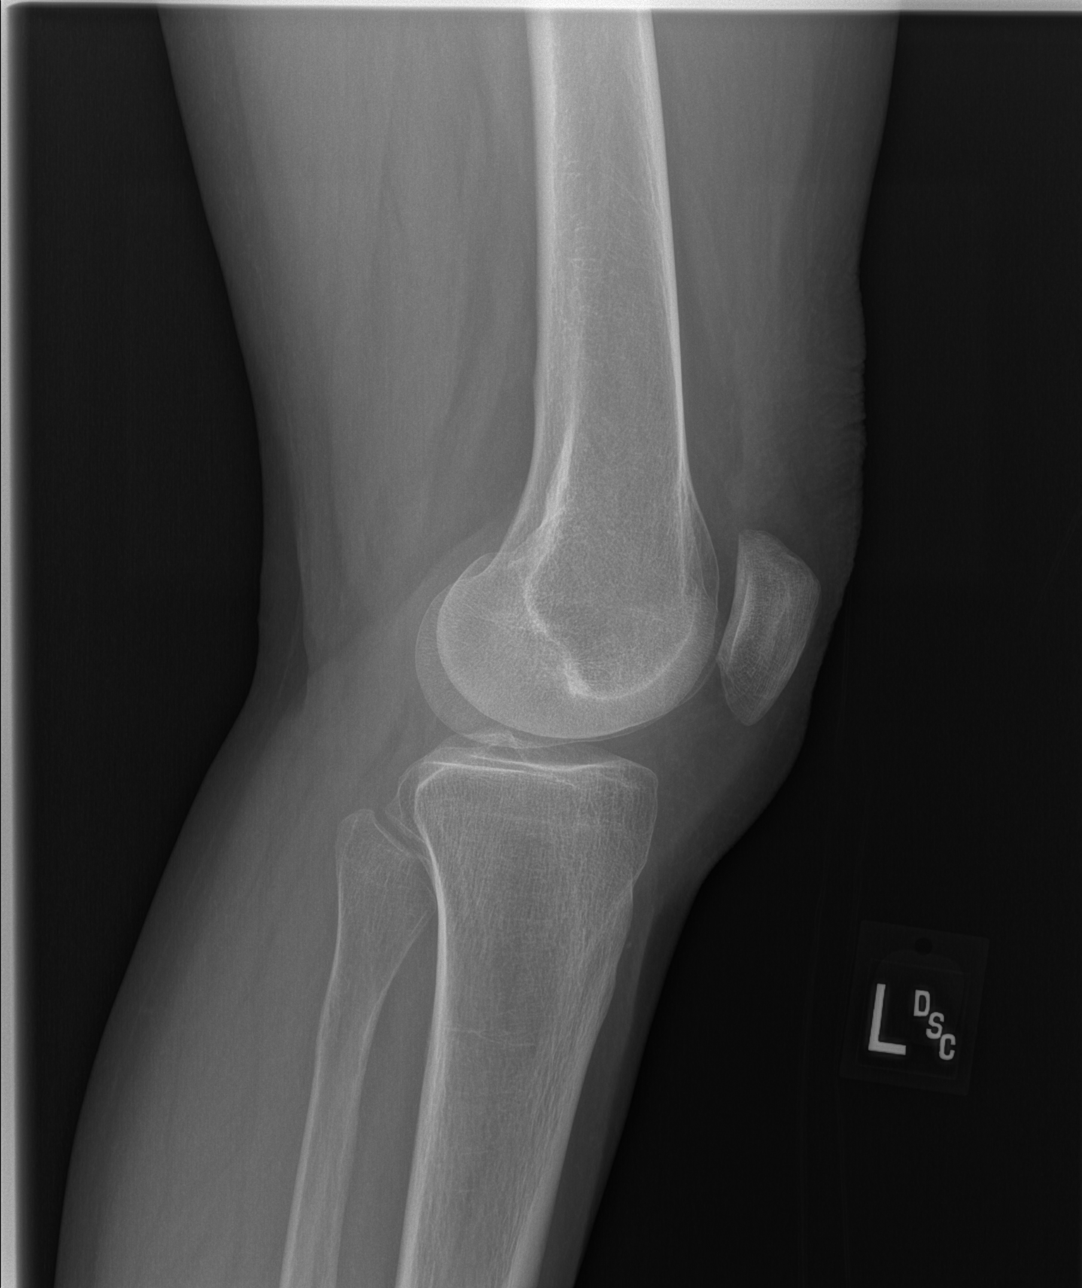

[4 of 4 positions shown; findings below may reference images not displayed]

FINDINGS: No evidence of fracture, dislocation, or joint effusion. No evidence
of arthropathy or other focal bone abnormality. Soft tissues are
unremarkable.
IMPRESSION: Negative.

## 2023-01-06 ENCOUNTER — Ambulatory Visit (HOSPITAL_COMMUNITY)
Admission: EM | Admit: 2023-01-06 | Discharge: 2023-01-06 | Disposition: A | Discharge status: Home / Self Care | Payer: Medicare HMO | Attending: Psychiatry | Admitting: Psychiatry

## 2023-01-06 DIAGNOSIS — F308 Other manic episodes: Secondary | ICD-10-CM | POA: Diagnosis present

## 2023-01-06 DIAGNOSIS — F209 Schizophrenia, unspecified: Secondary | ICD-10-CM | POA: Diagnosis not present

## 2023-01-06 DIAGNOSIS — F319 Bipolar disorder, unspecified: Secondary | ICD-10-CM | POA: Insufficient documentation

## 2023-01-06 MED ORDER — ALUM & MAG HYDROXIDE-SIMETH 200-200-20 MG/5ML PO SUSP: 30.0000 mL | ORAL | Status: DC | PRN

## 2023-01-06 MED ORDER — LEVOTHYROXINE SODIUM 88 MCG PO TABS: 88.0000 ug | ORAL_TABLET | Freq: Every day | ORAL | Status: DC

## 2023-01-06 MED ORDER — LORAZEPAM 1 MG PO TABS: 1.0000 mg | ORAL_TABLET | ORAL | Status: DC | PRN

## 2023-01-06 MED ORDER — HYDROXYZINE HCL 25 MG PO TABS: 25.0000 mg | ORAL_TABLET | Freq: Three times a day (TID) | ORAL | Status: DC | PRN

## 2023-01-06 MED ORDER — OLANZAPINE 5 MG PO TBDP: 5.0000 mg | ORAL_TABLET | Freq: Three times a day (TID) | ORAL | Status: DC | PRN

## 2023-01-06 MED ORDER — MAGNESIUM HYDROXIDE 400 MG/5ML PO SUSP: 30.0000 mL | Freq: Every day | ORAL | Status: DC | PRN

## 2023-01-06 MED ORDER — ACETAMINOPHEN 325 MG PO TABS: 650.0000 mg | ORAL_TABLET | Freq: Four times a day (QID) | ORAL | Status: DC | PRN

## 2023-01-06 MED ORDER — TRAZODONE HCL 50 MG PO TABS: 50.0000 mg | ORAL_TABLET | Freq: Every evening | ORAL | Status: DC | PRN
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
## 2023-01-06 NOTE — Discharge Instructions (Addendum)
Please follow up with outpatient appointment at [facility] on 01/24/23 at 1:30PM. Please arrive 30 minutes earlier than your appointment time so that you can complete necessary paperwork. If you need to reschedule or cancel your appointment, you must do so 24 hours in advance.  [address] [place], [place] [address] [phone]

## 2023-01-06 NOTE — ED Triage Notes (Addendum)
Pt presents to [facility] accompanied by her friend voluntarily. Pt appears to be manic, tangential speech, laughing inappropriately, dry heaving. Pt states "I have to vomit" and begins to dry heave in emesis bag. Pt was escorted by her co-worker who states the pt arrived at work and started to make statements that did not make sense, so she brought the pt to this facility. Pt reports being diagnosed with Bipolar disorder and anxiety and has not been on medication in quite some time. Pt reports she is also an alcoholic and her last consumption was on Sunday 1/21. Pt denies drug or alcohol use. Pt denies SI/HI and AVH.

## 2023-01-06 NOTE — ED Provider Notes (Cosign Needed Addendum)
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  Pt is accompanied by her coworker, Gwinda Passe. Pt gives verbal consent for Gwinda Passe to remain throughout the assessment. Pt is assessed face-to-face by nurse practitioner.   Chelsea Grant, 62 y.o., female patient seen face to face by this provider, consulted with Dr. Dwyane Dee; and chart reviewed on 01/06/23.  On evaluation Chelsea Grant reports "I wanted to get a steroid shot." She states she was brought here being told she would get a steroid shot although understands purpose of assessment for psychiatric/medical screening exam.   Per Gwinda Passe, she was concerned as pt was acting bizarre at work today, jumping from topic to topic. During triage at this facility, pt was noted to be tangential, laughing inappropriately, dry heaving.   On assessment, pt is alert and oriented to time, place, situation, person. She is dressed in scrubs (work uniform), fairly groomed, appropriate for environment. She makes fair eye contact. Speech volume is normal. Speech is clear and coherent with normal rate. She is hyperverbal. Reported mood is "feel like a ragdoll". Affect is mildly expansive. Thought process is disorganized with description of associations circumstantial. Thought content is logical. She appears distractible. She reports fair appetite. When asked about meals she has eaten recently, she states "peanut butter". When asked about sleep, she states she is sleeping 7 hours/night. She states she feels like she has no energy. She denies suicidal, homicidal or violent ideation. She denies auditoryvisual hallucinations or paranoia. She does not appear to be responding to internal stimuli.  No delusions or paranoia is elicited during the assessment. She reports prior psychiatric history includes bipolar disorder, schizophrenia, anxiety, alcoholism. She reports multiple previous psychiatric medication trials, although when asked about these, states she cannot recall them all. When I ask about zyprexa, risperdal, seroquel, she states she has tried these and they do not work. She states she might have had hives with seroquel. She states the only medication that did work for her was depakote and she likes depakote. She denies she is taking any medical medications or has any medical conditions, although then states she has hypothyroidism and is taking levothyroxine. She denies knowledge of family psychiatric history. She denies access to a firearm or other weapon. She denies pending charges or court dates. She states she is living alone. She denies any substance use, including alcohol, marijuana, crack/cocaine, methamphetamines. She is cooperative, pleasant. There is no evidence of agitation or aggression.  Discussed with pt and Betsy admission to continuous assessment. Pt was initially agreeable to admission to continuous assessment.   Betsy left. Pt then changed her mind about staying. She stated she would prefer to follow up with outpatient psychiatry. She does not meet criteria for involuntary commitment at this time. Appointment was made with pt for outpatient psychiatry follow up on 01/24/23 at 1:30PM at [place]. Discussed if there is any worsening psychiatric or medical condition, to call 911/EMS, go to the nearest emergency room or crisis center. Pt verbalized understanding.  [place] ED from 01/06/2023 in [place] No Risk       Psychiatric Specialty Exam  Presentation  General Appearance:Appropriate for Environment; Fairly Groomed; Other (comment) (scrubs (work uniform))  Eye Contact:Fair  Speech:Clear  and Coherent; Other (comment); Normal Rate (hyperverbal)  Speech Volume:Normal  Handedness:Right   Mood and Affect  Mood: -- ("feel like a ragdoll")  Affect: Other (comment) (mildly expansive)   Thought Process  Thought Processes: Disorganized  Descriptions of Associations:Circumstantial  Orientation:Full (Time, Place and Person)  Thought Content:Logical  Diagnosis of Schizophrenia or Schizoaffective disorder in past: No   Hallucinations:None  Ideas of Reference:None  Suicidal Thoughts:No  Homicidal Thoughts:No   Sensorium  Memory: Recent Poor; Remote Poor; Immediate Fair  Judgment: Intact  Insight: Shallow   Executive Functions  Concentration: Poor  Attention Span: Poor  Recall: Poor  Fund of Knowledge: Fair  Language: Fair   Psychomotor Activity  Psychomotor Activity: Normal   Assets  Assets: Communication Skills; Desire for Improvement; Financial Resources/Insurance; Housing; Resilience; Physical Health; Social Support; Vocational/Educational   Sleep  Sleep: Fair  Number of hours:  0 (reports 7 hours)   Physical Exam: Physical Exam Constitutional:      General: She is not in acute distress.    Appearance: She is not ill-appearing, toxic-appearing or diaphoretic.  Eyes:     General: No scleral icterus. Cardiovascular:     Rate and Rhythm: Normal rate.  Pulmonary:     Effort: Pulmonary effort is normal. No respiratory distress.  Neurological:     Mental Status: She is alert and oriented to person, place, and time.  Psychiatric:        Attention and Perception: Perception normal. She is inattentive.        Behavior: Behavior is hyperactive. Behavior is cooperative.    Review of Systems  Constitutional:  Negative for chills and fever.  Respiratory:  Negative for shortness of breath.   Cardiovascular:  Negative for chest pain and palpitations.  Gastrointestinal:  Positive for nausea. Negative for abdominal pain.        Reports nausea is currently resolved  Musculoskeletal:        Knee pain    Blood pressure 126/77, pulse 90, resp. rate 18, SpO2 100 %. There is no height or weight on file to calculate BMI.  Musculoskeletal: Strength & Muscle Tone: within normal limits Gait & Station: normal Patient leans: N/A  [place] [facility] Discharge Disposition for Follow up and Recommendations: Based on my evaluation the patient does not appear to have an emergency medical condition and can be discharged with resources and follow up care in outpatient services for Medication Management and Individual Therapy  Tharon Aquas, NP 01/06/2023, 3:31 PM

## 2023-07-11 ENCOUNTER — Ambulatory Visit
Admission: EM | Admit: 2023-07-11 | Discharge: 2023-07-11 | Disposition: A | Discharge status: Home / Self Care | Payer: Medicare HMO

## 2023-07-11 DIAGNOSIS — G8929 Other chronic pain: Secondary | ICD-10-CM | POA: Diagnosis not present

## 2023-07-11 DIAGNOSIS — M25561 Pain in right knee: Secondary | ICD-10-CM

## 2023-07-11 DIAGNOSIS — M25562 Pain in left knee: Secondary | ICD-10-CM | POA: Diagnosis not present

## 2023-07-11 MED ORDER — TRAMADOL HCL 50 MG PO TABS
50.0000 mg | ORAL_TABLET | Freq: Four times a day (QID) | ORAL | 0 refills | Status: AC | PRN
Start: 2023-07-11 — End: ?

## 2023-07-11 NOTE — Discharge Instructions (Signed)
Take the acetaminophen ( tylenol) no mote than 6 pills a day Take the tramadol if pain severe Tramadol will not be refilled Make an appointment with your PCP for follow up.  They should manage your chronic pain

## 2023-07-11 NOTE — ED Triage Notes (Signed)
C/o pain "I've been in bed since February"

## 2023-07-11 NOTE — ED Provider Notes (Signed)
[name]    CSN: [id number] Arrival date & time: 07/11/23  1139      History   Chief Complaint No chief complaint on file.   HPI Chelsea Grant is a 62 y.o. female.   HPI  Complicated patient with chronic mental illness, history of substance abuse, alcoholism and nicotine dependence.  Frequent ER visits for pain.  ER diagnosis of drug-seeking behavior.  At her last visit to the emergency room she was identified as having a high acetaminophen level (32).  She states Toradol shots gives very brief improvement.  She states "none" of the NSAIDs work. She states both of her knees hurt.  Both of her arms hurt.  She complains her abdomen is bloated.  She states that she is unable to drive.  She states that she relies on family for transportation.  She states this is the reason she has been unable to see her primary care doctor for 9 or 10 months. She says she is taking her Synthroid daily although the last measured TSH was 19 She clearly has multiple medical problems that are not being addressed   Past Medical History:  Diagnosis Date   Psychosis (HCC)    Thyroid disease     Patient Active Problem List   Diagnosis Date Noted   Adjustment disorder with mixed anxiety and depressed mood 05/21/2020   Bipolar disorder with psychotic features (HCC) 04/20/2019   Nicotine dependence 02/06/2017   Alcohol use disorder, severe, dependence (HCC) 11/10/2015   History of migraine 04/11/2014   Psychosis (HCC) 03/17/2011    History reviewed. No pertinent surgical history.  OB History   No obstetric history on file.      Home Medications    Prior to Admission medications   Medication Sig Start Date End Date Taking? Authorizing Provider  traMADol (ULTRAM) 50 MG tablet Take 1 tablet (50 mg total) by mouth every 6 (six) hours as needed. 07/11/23  Yes Eustace Moore, MD  acetaminophen (TYLENOL) 500 MG tablet Take 500 mg by mouth every 6 (six) hours as needed for mild  pain.    [provider]  Ascorbic Acid (VITAMIN C PO) Take 1 tablet by mouth daily.    [provider]  BIOTIN PO Take 1 tablet by mouth daily.    [provider]  buPROPion (WELLBUTRIN) 75 MG tablet Take 75 mg by mouth 2 (two) times daily. 12/04/22   [provider]  busPIRone (BUSPAR) 10 MG tablet Take 10 mg by mouth 2 (two) times daily as needed (For anxiety). 12/04/22   [provider]  cyclobenzaprine (FLEXERIL) 10 MG tablet Take 10 mg by mouth 3 (three) times daily as needed for muscle spasms. 08/23/22   [provider]  levothyroxine (SYNTHROID) 88 MCG tablet Take 88 mcg by mouth daily. 11/10/22   [provider]  Multiple Vitamins-Minerals (HAIR SKIN & NAILS PO) Take 1 tablet by mouth daily.    [provider]  risperiDONE (RISPERDAL) 2 MG tablet Take by mouth. 05/07/20   [provider]    Family History Family History  Problem Relation Age of Onset   Cancer Mother     Social History Social History   Tobacco Use   Smoking status: Every Day    Current packs/day: 1.00    Average packs/day: 1 pack/day for 35.0 years (35.0 ttl pk-yrs)    Types: Cigarettes   Smokeless tobacco: Never  Vaping Use   Vaping status: Never Used  Substance Use  Topics   Alcohol use: Yes    Alcohol/week: 7.0 standard drinks of alcohol    Types: 7 Cans of beer per week   Drug use: Never     Allergies   Codeine and Seroquel [quetiapine]   Review of Systems Review of Systems See HPI  Physical Exam Triage Vital Signs ED Triage Vitals  Encounter Vitals Group     BP 07/11/23 1151 120/79     Systolic BP Percentile --      Diastolic BP Percentile --      Pulse Rate 07/11/23 1151 85     Resp 07/11/23 1151 16     Temp 07/11/23 1151 97.8 F (36.6 C)     Temp Source 07/11/23 1151 Oral     SpO2 07/11/23 1151 95 %     Weight --      Height --      Head Circumference --      Peak Flow --      Pain Score 07/11/23  1150 8     Pain Loc --      Pain Education --      Exclude from Growth Chart --    No data found.  Updated Vital Signs BP 120/79   Pulse 85   Temp 97.8 F (36.6 C) (Oral)   Resp 16   SpO2 95%       Physical Exam Constitutional:      General: She is not in acute distress.    Appearance: She is well-developed.  HENT:     Head: Normocephalic and atraumatic.  Eyes:     Conjunctiva/sclera: Conjunctivae normal.     Pupils: Pupils are equal, round, and reactive to light.  Cardiovascular:     Rate and Rhythm: Normal rate.  Pulmonary:     Effort: Pulmonary effort is normal. No respiratory distress.  Abdominal:     General: There is distension.     Palpations: Abdomen is soft.     Comments: Patient has thin extremities and distended abdomen.  Musculoskeletal:        General: Normal range of motion.     Cervical back: Normal range of motion.     Comments: Knee joints are without any warmth or swelling.  There is tenderness.  Full range of motion, normal gait  Skin:    General: Skin is warm and dry.  Neurological:     Mental Status: She is alert.      UC Treatments / Results  Labs (all labs ordered are listed, but only abnormal results are displayed) Labs Reviewed - No data to display  EKG   Radiology No results found.  Procedures Procedures (including critical care time)  Medications Ordered in UC Medications - No data to display  Initial Impression / Assessment and Plan / UC Course  I have reviewed the triage vital signs and the nursing notes.  Pertinent labs & imaging results that were available during my care of the patient were reviewed by me and considered in my medical decision making (see chart for details).     Patient has a bottle from her last visit to the emergency room on 05/26/2023 where she was prescribed tramadol 50 mg.  There are 4 pills left in the bottle.  They are identified as tramadol.  She states she would like a refill of this  medication for days she has severe pain.  She is advised to reduce her overall Tylenol intake.  She is advised that it is  important to follow-up with her PCP.  I gave her thE phone number of a PCP closer to her home who is taking new patients if she chooses to change.  I did look her up in the narcotic database that she has a score of 0. Final Clinical Impressions(s) / UC Diagnoses   Final diagnoses:  Other chronic pain  Chronic pain of both knees     Discharge Instructions      Take the acetaminophen ( tylenol) no mote than 6 pills a day Take the tramadol if pain severe Tramadol will not be refilled Make an appointment with your PCP for follow up.  They should manage your chronic pain    ED Prescriptions     Medication Sig Dispense Auth. Provider   traMADol (ULTRAM) 50 MG tablet Take 1 tablet (50 mg total) by mouth every 6 (six) hours as needed. 15 tablet Eustace Moore, MD      I have reviewed the PDMP during this encounter.   Eustace Moore, MD 07/11/23 [phone]

## 2023-07-11 NOTE — ED Triage Notes (Signed)
Now states she has bil knee pain

## 2024-05-24 ENCOUNTER — Emergency Department (HOSPITAL_COMMUNITY)
Admission: EM | Admit: 2024-05-24 | Discharge: 2024-05-24 | Disposition: A | Discharge status: Home / Self Care | Attending: Emergency Medicine | Admitting: Emergency Medicine

## 2024-05-24 ENCOUNTER — Other Ambulatory Visit: Payer: Self-pay

## 2024-05-24 ENCOUNTER — Emergency Department (HOSPITAL_COMMUNITY)

## 2024-05-24 DIAGNOSIS — R519 Headache, unspecified: Secondary | ICD-10-CM | POA: Insufficient documentation

## 2024-05-24 DIAGNOSIS — R1013 Epigastric pain: Secondary | ICD-10-CM | POA: Insufficient documentation

## 2024-05-24 DIAGNOSIS — R109 Unspecified abdominal pain: Secondary | ICD-10-CM

## 2024-05-24 DIAGNOSIS — R079 Chest pain, unspecified: Secondary | ICD-10-CM | POA: Diagnosis not present

## 2024-05-24 DIAGNOSIS — E871 Hypo-osmolality and hyponatremia: Secondary | ICD-10-CM | POA: Insufficient documentation

## 2024-05-24 LAB — BASIC METABOLIC PANEL WITH GFR
Anion gap: 9 (ref 5–15)
BUN: 30 mg/dL — ABNORMAL HIGH (ref 8–23)
CO2: 23 mmol/L (ref 22–32)
Calcium: 9 mg/dL (ref 8.9–10.3)
Chloride: 98 mmol/L (ref 98–111)
Creatinine, Ser: 0.68 mg/dL (ref 0.44–1.00)
GFR, Estimated: >60 mL/min (ref >60)
Glucose, Bld: 80 mg/dL (ref 70–99)
Potassium: 4.4 mmol/L (ref 3.5–5.1)
Sodium: 130 mmol/L — ABNORMAL LOW (ref 135–145)

## 2024-05-24 LAB — HEPATIC FUNCTION PANEL
ALT: 20 U/L (ref 0–44)
AST: 15 U/L (ref 15–41)
Albumin: 3.4 g/dL — ABNORMAL LOW (ref 3.5–5.0)
Alkaline Phosphatase: 54 U/L (ref 38–126)
Bilirubin, Direct: 0.1 mg/dL (ref 0.0–0.2)
Indirect Bilirubin: 0.5 mg/dL (ref 0.3–0.9)
Total Bilirubin: 0.6 mg/dL (ref 0.0–1.2)
Total Protein: 6 g/dL — ABNORMAL LOW (ref 6.5–8.1)

## 2024-05-24 LAB — CBC
HCT: 38.6 % (ref 36.0–46.0)
Hemoglobin: 13 g/dL (ref 12.0–15.0)
MCH: 34.2 pg — ABNORMAL HIGH (ref 26.0–34.0)
MCHC: 33.7 g/dL (ref 30.0–36.0)
MCV: 101.6 fL — ABNORMAL HIGH (ref 80.0–100.0)
Platelets: 545 10*3/uL — ABNORMAL HIGH (ref 150–400)
RBC: 3.8 MIL/uL — ABNORMAL LOW (ref 3.87–5.11)
RDW: 13.3 % (ref 11.5–15.5)
WBC: 8.9 10*3/uL (ref 4.0–10.5)
nRBC: 0 % (ref 0.0–0.2)

## 2024-05-24 LAB — TROPONIN I (HIGH SENSITIVITY)
Troponin I (High Sensitivity): <2 ng/L (ref <18)
Troponin I (High Sensitivity): <2 ng/L (ref <18)

## 2024-05-24 LAB — LIPASE, BLOOD: Lipase: 39 U/L (ref 11–51)

## 2024-05-24 MED ORDER — ONDANSETRON HCL 4 MG/2ML IJ SOLN
4.0000 mg | Freq: Once | INTRAMUSCULAR | Status: AC
  Administered 2024-05-24: 4 mg via INTRAVENOUS
  Filled 2024-05-24: qty 2

## 2024-05-24 MED ORDER — MORPHINE SULFATE (PF) 4 MG/ML IV SOLN
6.0000 mg | Freq: Once | INTRAVENOUS | Status: AC
  Administered 2024-05-24: 6 mg via INTRAVENOUS
  Filled 2024-05-24: qty 2

## 2024-05-24 MED ORDER — IOHEXOL 300 MG/ML  SOLN
100.0000 mL | Freq: Once | INTRAMUSCULAR | Status: AC | PRN
  Administered 2024-05-24: 100 mL via INTRAVENOUS

## 2024-05-24 NOTE — ED Provider Notes (Signed)
 [place] EMERGENCY DEPARTMENT AT [facility] Provider Note   CSN: [id number] Arrival date & time: 05/24/24  1746     Patient presents with: Chest Pain, Headache, and Knee Pain   Tangala Wiegert is a 63 y.o. female.   63 year old female presents with ongoing epigastric abdominal discomfort.  States that she has also noted abdominal distention with which really began yesterday.  Was seen at urgent care today for similar symptoms and diagnosed with GERD.  She had acute abdominal series which did show increased stool.  She denies any fever or emesis at this time.  Has been passing flatus.  States that this discomfort starts in her upper belly and goes to her chest.  Denies any cardiac symptomatology.  No black stools appreciated.       Prior to Admission medications   Medication Sig Start Date End Date Taking? Authorizing Provider  acetaminophen  (TYLENOL ) 500 MG tablet Take 500 mg by mouth every 6 (six) hours as needed for mild pain.    [provider]  Ascorbic Acid (VITAMIN C PO) Take 1 tablet by mouth daily.    [provider]  BIOTIN PO Take 1 tablet by mouth daily.    [provider]  buPROPion (WELLBUTRIN) 75 MG tablet Take 75 mg by mouth 2 (two) times daily. 12/04/22   [provider]  busPIRone (BUSPAR) 10 MG tablet Take 10 mg by mouth 2 (two) times daily as needed (For anxiety). 12/04/22   [provider]  cyclobenzaprine (FLEXERIL) 10 MG tablet Take 10 mg by mouth 3 (three) times daily as needed for muscle spasms. 08/23/22   [provider]  levothyroxine  (SYNTHROID ) 88 MCG tablet Take 88 mcg by mouth daily. 11/10/22   [provider]  Multiple Vitamins-Minerals (HAIR SKIN & NAILS PO) Take 1 tablet by mouth daily.    [provider]  risperiDONE (RISPERDAL) 2 MG tablet Take by mouth. 05/07/20   [provider]  traMADol  (ULTRAM ) 50 MG tablet Take 1 tablet (50 mg total) by mouth every 6  (six) hours as needed. 07/11/23   Stephany Ehrich, MD    Allergies: Codeine and Seroquel [quetiapine]    Review of Systems  All other systems reviewed and are negative.   Updated Vital Signs BP 117/76 (BP Location: Right Arm)   Pulse 90   Temp 98.1 F (36.7 C) (Oral)   Resp 18   Ht 1.651 m (5' 5)   Wt 68 kg   SpO2 98%   BMI 24.96 kg/m   Physical Exam Vitals and nursing note reviewed.  Constitutional:      General: She is not in acute distress.    Appearance: Normal appearance. She is well-developed. She is not toxic-appearing.  HENT:     Head: Normocephalic and atraumatic.   Eyes:     General: Lids are normal.     Conjunctiva/sclera: Conjunctivae normal.     Pupils: Pupils are equal, round, and reactive to light.   Neck:     Thyroid : No thyroid  mass.     Trachea: No tracheal deviation.   Cardiovascular:     Rate and Rhythm: Normal rate and regular rhythm.     Heart sounds: Normal heart sounds. No murmur heard.    No gallop.  Pulmonary:     Effort: Pulmonary effort is normal. No respiratory distress.     Breath sounds: Normal breath sounds. No stridor. No decreased breath sounds, wheezing, rhonchi or rales.  Abdominal:  General: There is distension.     Palpations: Abdomen is soft.     Tenderness: There is abdominal tenderness in the epigastric area. There is no guarding or rebound.   Musculoskeletal:        General: No tenderness. Normal range of motion.     Cervical back: Normal range of motion and neck supple.   Skin:    General: Skin is warm and dry.     Findings: No abrasion or rash.   Neurological:     Mental Status: She is alert and oriented to person, place, and time. Mental status is at baseline.     GCS: GCS eye subscore is 4. GCS verbal subscore is 5. GCS motor subscore is 6.     Cranial Nerves: No cranial nerve deficit.     Sensory: No sensory deficit.     Motor: Motor function is intact.   Psychiatric:        Attention and  Perception: Attention normal.        Speech: Speech normal.        Behavior: Behavior normal.     (all labs ordered are listed, but only abnormal results are displayed) Labs Reviewed  CBC - Abnormal; Notable for the following components:      Result Value   RBC 3.80 (*)    MCV 101.6 (*)    MCH 34.2 (*)    Platelets 545 (*)    All other components within normal limits  BASIC METABOLIC PANEL WITH GFR  TROPONIN I (HIGH SENSITIVITY)    EKG: EKG Interpretation Date/Time:  Friday May 24 2024 17:58:53 EDT Ventricular Rate:  85 PR Interval:  116 QRS Duration:  82 QT Interval:  366 QTC Calculation: 436 R Axis:   90  Text Interpretation: Sinus rhythm Borderline short PR interval Borderline right axis deviation Confirmed by Lind Repine ([id number]) on 05/24/2024 6:49:26 PM  Radiology: [facility] Query Chest 2 View Result Date: 05/24/2024 CLINICAL DATA:  Chest pain EXAM: CHEST - 2 VIEW COMPARISON:  07/01/2021 FINDINGS: The heart size and mediastinal contours are within normal limits. Both lungs are clear. The visualized skeletal structures are unremarkable. IMPRESSION: No active cardiopulmonary disease. Electronically Signed   By: Violeta Grey M.D.   On: 05/24/2024 18:29     Procedures   Medications Ordered in the ED  morphine (PF) 4 MG/ML injection 6 mg (has no administration in time range)  ondansetron  (ZOFRAN ) injection 4 mg (has no administration in time range)                                    Medical Decision Making Amount and/or Complexity of Data Reviewed Labs: ordered. Radiology: ordered.  Risk Prescription drug management.   Patient had EKG which was normal sinus rhythm.  No ischemic changes noted.  Cardiac workup here is negative.  Due to patient's Donnell discomfort she abdominal CT which did not show any acute findings.  Treated with medications here and does feel better.  Chest x-ray also shows no acute findings.  Labs are without significant abnormality.  Lipase is normal.   Mild hyponatremia.  She was given IV fluids.  Patient feels better at this time and will be discharged home and will follow-up with her doctor.     Final diagnoses:  None    ED Discharge Orders     None          Lind Repine,  MD 05/24/24 2240

## 2024-05-24 NOTE — ED Triage Notes (Signed)
 Pt arrives today ambulatory to triage. She complains if chest, bilateral knees, and head pain. Pt admits to curb this pain, she takes 5 (500mg ) acetaminophen  at once, and states this makes her fall asleep. Pt took 1,000mg  acetaminophen  before coming in today. Pt states she has new ascites, and L rib pain as well.

## 2024-05-24 NOTE — ED Notes (Signed)
 This RN went to discharge pt. IV found on bedside table. Pt not in room.
